# Patient Record
Sex: Female | Born: 1985 | Race: White | Hispanic: No | Marital: Single | Smoking: Former smoker
Health system: Southern US, Community
[De-identification: ages and names within clinical notes are randomized; demographics above are authoritative.]

---

## 2019-09-26 ENCOUNTER — Other Ambulatory Visit: Payer: Self-pay

## 2019-09-26 DIAGNOSIS — K567 Ileus, unspecified: Secondary | ICD-10-CM | POA: Diagnosis present

## 2019-09-26 DIAGNOSIS — Z87891 Personal history of nicotine dependence: Secondary | ICD-10-CM

## 2019-09-26 DIAGNOSIS — Z79899 Other long term (current) drug therapy: Secondary | ICD-10-CM

## 2019-09-26 DIAGNOSIS — Z20822 Contact with and (suspected) exposure to covid-19: Secondary | ICD-10-CM | POA: Diagnosis present

## 2019-09-26 DIAGNOSIS — K589 Irritable bowel syndrome without diarrhea: Secondary | ICD-10-CM | POA: Diagnosis present

## 2019-09-26 DIAGNOSIS — K56609 Unspecified intestinal obstruction, unspecified as to partial versus complete obstruction: Secondary | ICD-10-CM | POA: Diagnosis not present

## 2019-09-26 DIAGNOSIS — D72829 Elevated white blood cell count, unspecified: Secondary | ICD-10-CM | POA: Diagnosis present

## 2019-09-26 DIAGNOSIS — Z791 Long term (current) use of non-steroidal anti-inflammatories (NSAID): Secondary | ICD-10-CM

## 2019-09-26 DIAGNOSIS — Z803 Family history of malignant neoplasm of breast: Secondary | ICD-10-CM

## 2019-09-26 DIAGNOSIS — Z882 Allergy status to sulfonamides status: Secondary | ICD-10-CM

## 2019-09-26 DIAGNOSIS — K56600 Partial intestinal obstruction, unspecified as to cause: Secondary | ICD-10-CM | POA: Diagnosis not present

## 2019-09-26 DIAGNOSIS — Z8249 Family history of ischemic heart disease and other diseases of the circulatory system: Secondary | ICD-10-CM

## 2019-09-26 LAB — URINALYSIS, COMPLETE (UACMP) WITH MICROSCOPIC
Bilirubin Urine: NEGATIVE
Glucose, UA: NEGATIVE mg/dL
Ketones, ur: NEGATIVE mg/dL
Nitrite: NEGATIVE
Protein, ur: NEGATIVE mg/dL
Specific Gravity, Urine: 1.018 (ref 1.005–1.030)
pH: 7 (ref 5.0–8.0)

## 2019-09-26 LAB — CBC
HCT: 45.7 % (ref 36.0–46.0)
Hemoglobin: 15.3 g/dL — ABNORMAL HIGH (ref 12.0–15.0)
MCH: 29.5 pg (ref 26.0–34.0)
MCHC: 33.5 g/dL (ref 30.0–36.0)
MCV: 88.2 fL (ref 80.0–100.0)
Platelets: 456 K/uL — ABNORMAL HIGH (ref 150–400)
RBC: 5.18 MIL/uL — ABNORMAL HIGH (ref 3.87–5.11)
RDW: 12.5 % (ref 11.5–15.5)
WBC: 13.4 K/uL — ABNORMAL HIGH (ref 4.0–10.5)
nRBC: 0 % (ref 0.0–0.2)

## 2019-09-26 LAB — COMPREHENSIVE METABOLIC PANEL
ALT: 14 U/L (ref 0–44)
AST: 20 U/L (ref 15–41)
Albumin: 4.7 g/dL (ref 3.5–5.0)
Alkaline Phosphatase: 63 U/L (ref 38–126)
Anion gap: 9 (ref 5–15)
BUN: 16 mg/dL (ref 6–20)
CO2: 27 mmol/L (ref 22–32)
Calcium: 8.9 mg/dL (ref 8.9–10.3)
Chloride: 102 mmol/L (ref 98–111)
Creatinine, Ser: 0.92 mg/dL (ref 0.44–1.00)
GFR calc Af Amer: >60 mL/min (ref >60)
GFR calc non Af Amer: >60 mL/min (ref >60)
Glucose, Bld: 121 mg/dL — ABNORMAL HIGH (ref 70–99)
Potassium: 4 mmol/L (ref 3.5–5.1)
Sodium: 138 mmol/L (ref 135–145)
Total Bilirubin: 0.7 mg/dL (ref 0.3–1.2)
Total Protein: 7.9 g/dL (ref 6.5–8.1)

## 2019-09-26 LAB — LIPASE, BLOOD: Lipase: 24 U/L (ref 11–51)

## 2019-09-26 LAB — POCT PREGNANCY, URINE: Preg Test, Ur: NEGATIVE

## 2019-09-26 NOTE — ED Triage Notes (Signed)
Pt to the er for abd pain x 1 week around the umbilicus. Pt denies period cramps. Pt states she has nausea when the pain is at it's worst. Pt denies with urination. Pt last BM was this am and regular. Pt reports worse pain with eating.

## 2019-09-27 ENCOUNTER — Emergency Department: Payer: Managed Care, Other (non HMO)

## 2019-09-27 ENCOUNTER — Other Ambulatory Visit: Payer: Self-pay

## 2019-09-27 ENCOUNTER — Inpatient Hospital Stay: Payer: Managed Care, Other (non HMO)

## 2019-09-27 ENCOUNTER — Inpatient Hospital Stay
Admission: EM | Admit: 2019-09-27 | Discharge: 2019-09-29 | DRG: 390 | Disposition: A | Discharge status: Home / Self Care | Payer: Managed Care, Other (non HMO) | Attending: Internal Medicine | Admitting: Internal Medicine

## 2019-09-27 DIAGNOSIS — K567 Ileus, unspecified: Secondary | ICD-10-CM | POA: Diagnosis present

## 2019-09-27 DIAGNOSIS — K581 Irritable bowel syndrome with constipation: Secondary | ICD-10-CM | POA: Diagnosis not present

## 2019-09-27 DIAGNOSIS — Z79899 Other long term (current) drug therapy: Secondary | ICD-10-CM | POA: Diagnosis not present

## 2019-09-27 DIAGNOSIS — K589 Irritable bowel syndrome without diarrhea: Secondary | ICD-10-CM | POA: Diagnosis present

## 2019-09-27 DIAGNOSIS — K56600 Partial intestinal obstruction, unspecified as to cause: Principal | ICD-10-CM

## 2019-09-27 DIAGNOSIS — Z20822 Contact with and (suspected) exposure to covid-19: Secondary | ICD-10-CM | POA: Diagnosis present

## 2019-09-27 DIAGNOSIS — Z882 Allergy status to sulfonamides status: Secondary | ICD-10-CM | POA: Diagnosis not present

## 2019-09-27 DIAGNOSIS — D72829 Elevated white blood cell count, unspecified: Secondary | ICD-10-CM | POA: Diagnosis present

## 2019-09-27 DIAGNOSIS — Z803 Family history of malignant neoplasm of breast: Secondary | ICD-10-CM | POA: Diagnosis not present

## 2019-09-27 DIAGNOSIS — Z87891 Personal history of nicotine dependence: Secondary | ICD-10-CM | POA: Diagnosis not present

## 2019-09-27 DIAGNOSIS — Z8249 Family history of ischemic heart disease and other diseases of the circulatory system: Secondary | ICD-10-CM | POA: Diagnosis not present

## 2019-09-27 DIAGNOSIS — K56609 Unspecified intestinal obstruction, unspecified as to partial versus complete obstruction: Secondary | ICD-10-CM | POA: Diagnosis present

## 2019-09-27 DIAGNOSIS — Z791 Long term (current) use of non-steroidal anti-inflammatories (NSAID): Secondary | ICD-10-CM | POA: Diagnosis not present

## 2019-09-27 LAB — SARS CORONAVIRUS 2 (TAT 6-24 HRS): SARS Coronavirus 2: NEGATIVE

## 2019-09-27 MED ORDER — MORPHINE SULFATE (PF) 4 MG/ML IV SOLN
4.0000 mg | Freq: Once | INTRAVENOUS | Status: AC
  Administered 2019-09-27: 4 mg via INTRAVENOUS

## 2019-09-27 MED ORDER — ONDANSETRON HCL 4 MG PO TABS: 4.0000 mg | ORAL_TABLET | Freq: Four times a day (QID) | ORAL | Status: DC | PRN

## 2019-09-27 MED ORDER — ONDANSETRON HCL 4 MG/2ML IJ SOLN: 4.0000 mg | Freq: Four times a day (QID) | INTRAMUSCULAR | Status: DC | PRN

## 2019-09-27 MED ORDER — ACETAMINOPHEN 650 MG RE SUPP: 650.0000 mg | Freq: Four times a day (QID) | RECTAL | Status: DC | PRN

## 2019-09-27 MED ORDER — ACETAMINOPHEN 325 MG PO TABS: 650.0000 mg | ORAL_TABLET | Freq: Four times a day (QID) | ORAL | Status: DC | PRN

## 2019-09-27 MED ORDER — POTASSIUM CHLORIDE IN NACL 20-0.9 MEQ/L-% IV SOLN
INTRAVENOUS | Status: DC
  Filled 2019-09-27 (×5): qty 1000

## 2019-09-27 MED ORDER — ENOXAPARIN SODIUM 40 MG/0.4ML ~~LOC~~ SOLN
40.0000 mg | SUBCUTANEOUS | Status: DC
  Administered 2019-09-27: 40 mg via SUBCUTANEOUS
  Filled 2019-09-27 (×2): qty 0.4

## 2019-09-27 MED ORDER — MORPHINE SULFATE (PF) 2 MG/ML IV SOLN
2.0000 mg | INTRAVENOUS | Status: DC | PRN
  Administered 2019-09-27 – 2019-09-28 (×3): 2 mg via INTRAVENOUS
  Filled 2019-09-27 (×4): qty 1

## 2019-09-27 MED ORDER — LIDOCAINE VISCOUS HCL 2 % MT SOLN
15.0000 mL | Freq: Once | OROMUCOSAL | Status: AC
  Administered 2019-09-27: 15 mL via OROMUCOSAL
  Filled 2019-09-27: qty 15

## 2019-09-27 MED ORDER — TRAZODONE HCL 50 MG PO TABS: 25.0000 mg | ORAL_TABLET | Freq: Every evening | ORAL | Status: DC | PRN

## 2019-09-27 MED ORDER — IOHEXOL 300 MG/ML  SOLN
100.0000 mL | Freq: Once | INTRAMUSCULAR | Status: AC | PRN
  Administered 2019-09-27: 100 mL via INTRAVENOUS

## 2019-09-27 MED ORDER — MORPHINE SULFATE (PF) 4 MG/ML IV SOLN
4.0000 mg | Freq: Once | INTRAVENOUS | Status: AC
  Administered 2019-09-27: 4 mg via INTRAVENOUS
  Filled 2019-09-27: qty 1

## 2019-09-27 MED ORDER — MORPHINE SULFATE (PF) 4 MG/ML IV SOLN
INTRAVENOUS | Status: AC
  Filled 2019-09-27: qty 1

## 2019-09-27 MED ORDER — ONDANSETRON HCL 4 MG/2ML IJ SOLN
4.0000 mg | INTRAMUSCULAR | Status: AC
  Administered 2019-09-27: 4 mg via INTRAVENOUS

## 2019-09-27 MED ORDER — PANTOPRAZOLE SODIUM 40 MG IV SOLR
40.0000 mg | Freq: Two times a day (BID) | INTRAVENOUS | Status: DC
  Administered 2019-09-27 – 2019-09-28 (×3): 40 mg via INTRAVENOUS
  Filled 2019-09-27 (×3): qty 40

## 2019-09-27 MED ORDER — SODIUM CHLORIDE 0.9 % IV SOLN: Freq: Once | INTRAVENOUS | Status: AC

## 2019-09-27 MED ORDER — ONDANSETRON HCL 4 MG/2ML IJ SOLN
INTRAMUSCULAR | Status: AC
  Filled 2019-09-27: qty 2

## 2019-09-27 MED ORDER — SODIUM CHLORIDE 0.9 % IV BOLUS
1000.0000 mL | Freq: Once | INTRAVENOUS | Status: AC
  Administered 2019-09-27: 1000 mL via INTRAVENOUS

## 2019-09-27 MED ORDER — PHENOL 1.4 % MT LIQD
1.0000 | OROMUCOSAL | Status: DC | PRN
  Administered 2019-09-27: 1 via OROMUCOSAL
  Filled 2019-09-27: qty 177

## 2019-09-27 NOTE — Consult Note (Addendum)
Ann Gibson , MD [address] [place], [place], [place], [address] [address], [place], [place], [place], [address] Phone: [phone]  Fax: [phone]  Consultation  Referring Provider:   Dr. Sidney Ace primary Care Physician:  System, Pcp Not In Primary Gastroenterologist: None         Reason for Consultation:     Irritable bowel syndrome  Date of Admission:  09/27/2019 Date of Consultation:  09/27/2019         HPI:   Ann Gibson is a 34 y.o. female previously carries a diagnosis of irritable bowel syndrome.  Patient presented to the emergency room with abdominal pain with nausea and vomiting since early this morning.  In the emergency room she underwent a CT scan of the abdomen with contrast that demonstrated partial small bowel obstruction with transition in the mid abdomen.  No clear obstruction was noted.  She also went a right upper quadrant ultrasound which was normal.Hemoglobin of 15.3 g and LFTs are normal.  The patient was seen by Dr. Hampton Abbot.  He discussed with me and was concerned about thickening in the distal small bowel.  She states that about 2 weeks back she developed some generalized abdominal discomfort around her navel.  Worse when she ate something to scribes it as a sharp pain.  No clear relieving factors but made worse by eating.  Gradually the pain got worse and hence she came into the hospital after she had 1 bout of vomiting.  It was not clearly feculent by her description.  Presently she has no pain.  Denies any prior history of Crohn's disease or IBD.  No family history of the same.  She uses Excedrin on a regular basis for headaches.  Denies any change in bowel movements.  She does recall she has issues with her bowels ranging from constipation to diarrhea.  She particularly mentions that she has to drink something in the morning to stimulate a bowel movement.   History reviewed. No pertinent past medical history.  History reviewed. No pertinent surgical  history.  Prior to Admission medications   Medication Sig Start Date End Date Taking? Authorizing Provider  Aspirin-Acetaminophen-Caffeine (EXCEDRIN PO) Take 2 tablets by mouth every 6 (six) hours as needed.   Yes [provider]  Creatine Monohydrate POWD Take 0.5 g by mouth daily.   Yes [provider]  GLUTAMINE PO Take 1 each by mouth every morning.   Yes [provider]  ibuprofen (ADVIL) 200 MG tablet Take 200-400 mg by mouth every 6 (six) hours as needed for headache.   Yes [provider]  Multiple Vitamin (MULTIVITAMIN) capsule Take 1 capsule by mouth daily.   Yes [provider]  Probiotic Product (PROBIOTIC-10 PO) Take 1 capsule by mouth daily.   Yes [provider]  S-Adenosylmethionine (SAM-E) 200 MG TABS Take 400 mg by mouth daily.   Yes [provider]    No family history on file.   Social History   Tobacco Use  . Smoking status: Former Research scientist (life sciences)  . Smokeless tobacco: Never Used  Substance Use Topics  . Alcohol use: Yes    Comment: socail  . Drug use: Never    Allergies as of 09/26/2019 - Review Complete 09/26/2019  Allergen Reaction Noted  . Sulfa antibiotics  09/26/2019    Review of Systems:    All systems reviewed and negative except where noted in HPI.   Physical Exam:  Vital signs in last 24 hours: Temp:  [  98.4 F (36.9 C)-98.7 F (37.1 C)] 98.7 F (37.1 C) (04/08 0724) Pulse Rate:  [46-96] 77 (04/08 1400) Resp:  [12-25] 20 (04/08 1400) BP: (98-134)/(48-91) 123/71 (04/08 1400) SpO2:  [94 %-100 %] 96 % (04/08 1400) Weight:  [68 kg] 68 kg (04/07 2151)   General:   Pleasant, cooperative in NAD Head:  Normocephalic and atraumatic. Eyes:   No icterus.   Conjunctiva pink. PERRLA. Ears:  Normal auditory acuity. Lungs: Respirations even and unlabored. Lungs clear to auscultation bilaterally.   No wheezes, crackles, or rhonchi.  Heart:  Regular rate and rhythm;  Without murmur, clicks, rubs or  gallops Abdomen:  Soft, nondistended, nontender. Normal bowel sounds. No appreciable masses or hepatomegaly.  No rebound or guarding.  Neurologic:  Alert and oriented x3;  grossly normal neurologically. Psych:  Alert and cooperative. Normal affect.  LAB RESULTS: Recent Labs    09/26/19 2157  WBC 13.4*  HGB 15.3*  HCT 45.7  PLT 456*   BMET Recent Labs    09/26/19 2157  NA 138  K 4.0  CL 102  CO2 27  GLUCOSE 121*  BUN 16  CREATININE 0.92  CALCIUM 8.9   LFT Recent Labs    09/26/19 2157  PROT 7.9  ALBUMIN 4.7  AST 20  ALT 14  ALKPHOS 63  BILITOT 0.7   PT/INR No results for input(s): LABPROT, INR in the last 72 hours.  STUDIES: CT ABDOMEN PELVIS W CONTRAST  Result Date: 09/27/2019 CLINICAL DATA:  Nausea and vomiting with right lower quadrant pain EXAM: CT ABDOMEN AND PELVIS WITH CONTRAST TECHNIQUE: Multidetector CT imaging of the abdomen and pelvis was performed using the standard protocol following bolus administration of intravenous contrast. CONTRAST:  OMNIPAQUE IOHEXOL 300 MG/ML  SOLN COMPARISON:  None. FINDINGS: Lower chest:  No contributory findings. Hepatobiliary: No focal liver abnormality.No evidence of biliary obstruction or stone. Pancreas: Unremarkable. Spleen: Unremarkable. Adrenals/Urinary Tract: Negative adrenals. No hydronephrosis or stone. Unremarkable bladder. Stomach/Bowel: Small bowel loops are fluid-filled and dilated proximally and decompressed distally. There is a transition in the central abdomen without underlying twist, wall thickening, or mass. The mesentery of the dilated loops is mildly edematous. Negative colon. No appendicitis. Vascular/Lymphatic: No acute vascular abnormality. No mass or adenopathy. Reproductive:Negative. Other: Trace pelvic ascites considered reactive. Musculoskeletal: No acute abnormalities. IMPRESSION: Partial small bowel obstruction with transition in the mid abdomen. No visible underlying cause, has there been prior  abdominal surgery? Electronically Signed   By: Marnee Spring M.D.   On: 09/27/2019 05:39   US ABDOMEN LIMITED RUQ  Result Date: 09/27/2019 CLINICAL DATA:  Initial evaluation for episodic epigastric and right upper quadrant pain for 1 week with nausea and vomiting. EXAM: ULTRASOUND ABDOMEN LIMITED RIGHT UPPER QUADRANT COMPARISON:  None available. FINDINGS: Gallbladder: No gallstones or wall thickening visualized. No sonographic Murphy sign noted by sonographer. Common bile duct: Diameter: 4.9 mm Liver: No focal lesion identified. Within normal limits in parenchymal echogenicity. Portal vein is patent on color Doppler imaging with normal direction of blood flow towards the liver. Other: None. IMPRESSION: Normal right upper quadrant ultrasound. Electronically Signed   By: Rise Mu M.D.   On: 09/27/2019 04:23      Impression / Plan:   Ann Gibson is a 34 y.o. y/o female presents to the emergency room with abdominal pain.  CT scan of the abdomen shows small bowel obstruction with a transition point.  No clear obstruction is seen within the lumen.?  Adhesions.  She also carries  a diagnosis of irritable bowel syndrome for which I have been consulted.  She has been seen by Dr. Aleen Campi and surgery.  From the GI point of view irritable bowel syndrome is not a condition that needs to be addressed urgently as an inpatient.  It would be probably best addressed as an outpatient once she is past the acute phase of of the bowel obstruction.  Agree with NG tube which is already in place, correcting electrolytes, limiting use of narcotics.  Reimaging at some point once NG tube to suction has stopped..  I would suggesteither an MR enterography or a CT enterography which would be more accurate in evaluating the small bowel for mucosal inflammation/thickening and luminal lesions.  This is particularly helpful in evaluating of Crohn's disease.  I would also add a CRP to her labs to check for any inflammation  and obtain a baseline.  Chronic NSAID use may at times cause strictures of the small bowel.  Based on these results of the enterography we can decide timing of a colonoscopy with ileal intubation if appropriate.  Obviously at this point of time with her small bowel obstruction she can undergo colonoscopy as she cannot do a bowel prep.  I have strongly suggested that she stop using NSAIDs regularly and consult her neurologist for migraine prophylaxis  Thank you for involving me in the care of this patient.      LOS: 0 days   Wyline Mood, MD  09/27/2019, 2:45 PM

## 2019-09-27 NOTE — ED Notes (Signed)
Pt denies pain or nausea at this time. Pt is calm and happy. No distress.

## 2019-09-27 NOTE — ED Provider Notes (Signed)
[facility] Emergency Department Provider Note  ____________________________________________   First MD Initiated Contact with Patient 09/27/19 [phone]     (approximate)  I have reviewed the triage vital signs and the nursing notes.   HISTORY  Chief Complaint Abdominal Pain    HPI Ann Gibson is a 34 y.o. female with no chronic medical issues and no prior abdominal surgeries who presents for evaluation of about a week of intermittent right-sided abdominal pain.  She reports that sometimes it is sharp and severe and other times it is a dull aching pain.  Eating seems to make things worse including tonight when the pain was the most severe and encouraged her to come to the emergency department.  She has had less appetite over the last couple of days.  She has had nausea but did not vomit until tonight when the pain was at its worst.  It is frequently located in the right side or around her umbilicus, but tonight the pain was up higher, below her ribs in the center and on the right side.  It does not radiate through to her back.  She denies fever/chills, sore throat, chest pain, cough, shortness of breath, and dysuria.  No recent vaginal complaints or concerns and no recent vaginal bleeding.         History reviewed. No pertinent past medical history.  Patient Active Problem List   Diagnosis Date Noted  . SBO (small bowel obstruction) (HCC) 09/27/2019    History reviewed. No pertinent surgical history.  Prior to Admission medications   Medication Sig Start Date End Date Taking? Authorizing Provider  Aspirin-Acetaminophen-Caffeine (EXCEDRIN PO) Take 2 tablets by mouth every 6 (six) hours as needed.   Yes [provider]  Creatine Monohydrate POWD Take 0.5 g by mouth daily.   Yes [provider]  GLUTAMINE PO Take 1 each by mouth every morning.   Yes [provider]  ibuprofen (ADVIL) 200 MG tablet Take 200-400 mg by mouth every 6  (six) hours as needed for headache.   Yes [provider]  Multiple Vitamin (MULTIVITAMIN) capsule Take 1 capsule by mouth daily.   Yes [provider]  Probiotic Product (PROBIOTIC-10 PO) Take 1 capsule by mouth daily.   Yes [provider]  S-Adenosylmethionine (SAM-E) 200 MG TABS Take 400 mg by mouth daily.   Yes [provider]    Allergies Sulfa antibiotics  No family history on file.  Social History Social History   Tobacco Use  . Smoking status: Former Games developer  . Smokeless tobacco: Never Used  Substance Use Topics  . Alcohol use: Yes    Comment: socail  . Drug use: Never    Review of Systems Constitutional: No fever/chills Eyes: No visual changes. ENT: No sore throat. Cardiovascular: Denies chest pain. Respiratory: Denies shortness of breath. Gastrointestinal: Abdominal pain with nausea and vomiting as described above. Genitourinary: Negative for dysuria. Musculoskeletal: Negative for neck pain.  Negative for back pain. Integumentary: Negative for rash. Neurological: Negative for headaches, focal weakness or numbness.   ____________________________________________   PHYSICAL EXAM:  VITAL SIGNS: ED Triage Vitals  Enc Vitals Group     BP 09/26/19 2153 128/61     Pulse Rate 09/26/19 2153 83     Resp 09/26/19 2153 18     Temp 09/26/19 2153 98.4 F (36.9 C)     Temp Source 09/26/19 2153 Oral     SpO2 09/26/19 2153 100 %     Weight 09/26/19  2151 68 kg (150 lb)     Height 09/26/19 2151 1.626 m (5\' 4" )     Head Circumference --      Peak Flow --      Pain Score 09/26/19 2151 3     Pain Loc --      Pain Edu? --      Excl. in GC? --     Constitutional: Alert and oriented.  Appears uncomfortable. Eyes: Conjunctivae are normal.  Head: Atraumatic. Nose: No congestion/rhinnorhea. Mouth/Throat: Patient is wearing a mask. Neck: No stridor.  No meningeal signs.   Cardiovascular: Normal rate, regular rhythm. Good peripheral  circulation. Grossly normal heart sounds. Respiratory: Normal respiratory effort.  No retractions. Gastrointestinal: Soft and nondistended.  Mild tenderness to palpation throughout the upper abdomen, equivocal Murphy sign.  No lower abdominal tenderness and no rebound or guarding. Musculoskeletal: No lower extremity tenderness nor edema. No gross deformities of extremities. Neurologic:  Normal speech and language. No gross focal neurologic deficits are appreciated.  Skin:  Skin is warm, dry and intact. Psychiatric: Mood and affect are normal. Speech and behavior are normal.  ____________________________________________   LABS (all labs ordered are listed, but only abnormal results are displayed)  Labs Reviewed  COMPREHENSIVE METABOLIC PANEL - Abnormal; Notable for the following components:      Result Value   Glucose, Bld 121 (*)    All other components within normal limits  CBC - Abnormal; Notable for the following components:   WBC 13.4 (*)    RBC 5.18 (*)    Hemoglobin 15.3 (*)    Platelets 456 (*)    All other components within normal limits  URINALYSIS, COMPLETE (UACMP) WITH MICROSCOPIC - Abnormal; Notable for the following components:   Color, Urine YELLOW (*)    APPearance CLEAR (*)    Hgb urine dipstick SMALL (*)    Leukocytes,Ua TRACE (*)    Bacteria, UA RARE (*)    All other components within normal limits  SARS CORONAVIRUS 2 (TAT 6-24 HRS)  LIPASE, BLOOD  POC URINE PREG, ED  POCT PREGNANCY, URINE   ____________________________________________  EKG  None - EKG not ordered by ED physician ____________________________________________  RADIOLOGY 2152, personally viewed and evaluated these images (plain radiographs) as part of my medical decision making, as well as reviewing the written report by the radiologist.  ED MD interpretation:  partial SBO on CT, unremarkable U/S  Official radiology report(s): CT ABDOMEN PELVIS W CONTRAST  Result Date:  09/27/2019 CLINICAL DATA:  Nausea and vomiting with right lower quadrant pain EXAM: CT ABDOMEN AND PELVIS WITH CONTRAST TECHNIQUE: Multidetector CT imaging of the abdomen and pelvis was performed using the standard protocol following bolus administration of intravenous contrast. CONTRAST:  11/27/2019 OMNIPAQUE IOHEXOL 300 MG/ML  SOLN COMPARISON:  None. FINDINGS: Lower chest:  No contributory findings. Hepatobiliary: No focal liver abnormality.No evidence of biliary obstruction or stone. Pancreas: Unremarkable. Spleen: Unremarkable. Adrenals/Urinary Tract: Negative adrenals. No hydronephrosis or stone. Unremarkable bladder. Stomach/Bowel: Small bowel loops are fluid-filled and dilated proximally and decompressed distally. There is a transition in the central abdomen without underlying twist, wall thickening, or mass. The mesentery of the dilated loops is mildly edematous. Negative colon. No appendicitis. Vascular/Lymphatic: No acute vascular abnormality. No mass or adenopathy. Reproductive:Negative. Other: Trace pelvic ascites considered reactive. Musculoskeletal: No acute abnormalities. IMPRESSION: Partial small bowel obstruction with transition in the mid abdomen. No visible underlying cause, has there been prior abdominal surgery? Electronically Signed   By:  Watts M.D.   On: 09/27/2019 05:39   US ABDOMEN LIMITED RUQ  Result Date: 09/27/2019 CLINICAL DATA:  Initial evaluation for episodic epigastric and right upper quadrant pain for 1 week with nausea and vomiting. EXAM: ULTRASOUND ABDOMEN LIMITED RIGHT UPPER QUADRANT COMPARISON:  None available. FINDINGS: Gallbladder: No gallstones or wall thickening visualized. No sonographic Murphy sign noted by sonographer. Common bile duct: Diameter: 4.9 mm Liver: No focal lesion identified. Within normal limits in parenchymal echogenicity. Portal vein is patent on color Doppler imaging with normal direction of blood flow towards the liver. Other: None. IMPRESSION:  Normal right upper quadrant ultrasound. Electronically Signed   By: Rise Mu M.D.   On: 09/27/2019 04:23    ____________________________________________   PROCEDURES   Procedure(s) performed (including Critical Care):  Procedures   ____________________________________________   INITIAL IMPRESSION / MDM / ASSESSMENT AND PLAN / ED COURSE  As part of my medical decision making, I reviewed the following data within the electronic MEDICAL RECORD NUMBER History obtained from family, Nursing notes reviewed and incorporated, Labs reviewed , Old chart reviewed, Discussed with admitting physician  and Notes from prior ED visits   Differential diagnosis includes, but is not limited to, biliary colic or other gallbladder disease, appendicitis, epiploic appendagitis, mesenteric adenitis, ovarian cyst, UTI/pyelonephritis.  The history is most consistent with gallbladder disease although she is relatively pain-free now but that is after receiving morphine 4 mg IV and Zofran 4 mg IV.  Vital signs are stable and she is afebrile.  She has a leukocytosis of 13.4 and I would think that if she had been developing an appendicitis over the last 1+ week she would be considerably more ill at this point.  Urine pregnancy test is negative and there is no sign of UTI in the urinalysis.  Comprehensive metabolic panel including LFTs are within normal limits.  Discussed the plan of evaluation with the patient and she agrees with the current plan for ultrasound.  If the ultrasound is normal or equivocal I will proceed with CT scan of the abdomen and pelvis with IV contrast.      Clinical Course as of Sep 27 719  Thu Sep 27, 2019  0435 Normal ultrasound, so we will proceed with CT abd/pelvis  US ABDOMEN LIMITED RUQ [CF]  0540 CT scan is notable for partial bowel obstruction without obvious transition point.  No structural abnormalities that would provide an obvious cause.  I updated the patient and she said  that her pain is returning although her nausea has improved.  We discussed at length the various options and I think the appropriate answer is to admit the patient for bowel rest with NG tube placement.  She agrees with the plan.  Nonemergent Covid screening is pending.  NG tube will be placed with viscous lidocaine for comfort.  I will page the hospitalist service.   [CF]  [phone] Discussed case with Dr. Arville Care with the hospitalist service who will admit.   [CF]    Clinical Course User Index [CF] Loleta Rose, MD     ____________________________________________  FINAL CLINICAL IMPRESSION(S) / ED DIAGNOSES  Final diagnoses:  Partial small bowel obstruction (HCC)     MEDICATIONS GIVEN DURING THIS VISIT:  Medications  0.9 %  sodium chloride infusion (has no administration in time range)  morphine 4 MG/ML injection 4 mg (4 mg Intravenous Given 09/27/19 0357)  ondansetron (ZOFRAN) injection 4 mg (4 mg Intravenous Given 09/27/19 0357)  sodium chloride 0.9 % bolus 1,000 mL (  0 mLs Intravenous Stopped 09/27/19 0442)  iohexol (OMNIPAQUE) 300 MG/ML solution 100 mL (100 mLs Intravenous Contrast Given 09/27/19 0458)  lidocaine (XYLOCAINE) 2 % viscous mouth solution 15 mL (15 mLs Mouth/Throat Given 09/27/19 0645)  morphine 4 MG/ML injection 4 mg (4 mg Intravenous Given 09/27/19 0881)     ED Discharge Orders    None      *Please note:  Zasha Belleau was evaluated in Emergency Department on 09/27/2019 for the symptoms described in the history of present illness. She was evaluated in the context of the global COVID-19 pandemic, which necessitated consideration that the patient might be at risk for infection with the SARS-CoV-2 virus that causes COVID-19. Institutional protocols and algorithms that pertain to the evaluation of patients at risk for COVID-19 are in a state of rapid change based on information released by regulatory bodies including the CDC and federal and state organizations. These policies and  algorithms were followed during the patient's care in the ED.  Some ED evaluations and interventions may be delayed as a result of limited staffing during the pandemic.*  Note:  This document was prepared using Dragon voice recognition software and may include unintentional dictation errors.   Hinda Kehr, MD 09/27/19 [phone]

## 2019-09-27 NOTE — ED Notes (Signed)
Pt actively vomiting. Pt given an emesis bag.

## 2019-09-27 NOTE — H&P (Signed)
[facility] NAME: Ann Gibson    MR#:  [id number]  DATE OF BIRTH:  25-Mar-1986  DATE OF ADMISSION:  09/27/2019  PRIMARY CARE PHYSICIAN: System, Pcp Not In   REQUESTING/REFERRING PHYSICIAN: Hinda Kehr, MD CHIEF COMPLAINT:   Chief Complaint  Patient presents with   Abdominal Pain    HISTORY OF PRESENT ILLNESS:  Ann Gibson  is a 34 y.o. pleasant Caucasian female with a known history of suspected IBS, who presented to the emergency room with acute onset of intermittent abdominal pain for the last week with associated nausea and vomiting since early this morning.  She denied any diarrhea.  Her last bowel movement was yesterday morning and it was hard and small.  She denied any fever or chills however she stated that she has been feeling sweaty.  No cough or dyspnea.  No melena or bright red bleeding per rectum.  She denies any bilious vomitus or hematemesis.  No headache or dizziness or blurred vision.  She stated that she has been binge eating sometimes.  She is an no prescription medications for IBS.  She stated that caffeine has been regulating her bowel movements and lately she has used MCT Oil.  Upon presentation to the emergency room, vital signs were within normal.  Labs reveal leukocytosis of 13.4 and lipase was normal at 24.  Urinalysis was unremarkable and urine pregnancy test was negative.  Abdominal pelvic CT scan showed partial small bowel obstruction with transition in the mid abdomen.  Right upper quadrant ultrasound was normal.  The patient was given 4 mg of IV morphine sulfate twice and 4 mg of IV Zofran as well as 1 L bolus of IV normal saline followed 100 mL/h.  She had NG tube placed with aspiration of 200 mL gastric aspirate so far.  She will be admitted to bed for further evaluation and management. PAST MEDICAL HISTORY:  History reviewed. No pertinent past medical history.  Suspected history of IBS on no prescription medication  for it.  PAST SURGICAL HISTORY:  History reviewed. No pertinent surgical history.  She denies any previous surgeries.  SOCIAL HISTORY:   Social History   Tobacco Use   Smoking status: Former Smoker   Smokeless tobacco: Never Used  Substance Use Topics   Alcohol use: Yes    Comment: socail    FAMILY HISTORY:  No family history on file.  Maternal grandmother had breast cancer an MI.  DRUG ALLERGIES:   Allergies  Allergen Reactions   Sulfa Antibiotics Swelling    REVIEW OF SYSTEMS:   ROS As per history of present illness. All pertinent systems were reviewed above. Constitutional,  HEENT, cardiovascular, respiratory, GI, GU, musculoskeletal, neuro, psychiatric, endocrine,  integumentary and hematologic systems were reviewed and are otherwise  negative/unremarkable except for positive findings mentioned above in the HPI.   MEDICATIONS AT HOME:   Prior to Admission medications   Medication Sig Start Date End Date Taking? Authorizing Provider  Aspirin-Acetaminophen-Caffeine (EXCEDRIN PO) Take 2 tablets by mouth every 6 (six) hours as needed.   Yes [provider]  Creatine Monohydrate POWD Take 0.5 g by mouth daily.   Yes [provider]  GLUTAMINE PO Take 1 each by mouth every morning.   Yes [provider]  ibuprofen (ADVIL) 200 MG tablet Take 200-400 mg by mouth every 6 (six) hours as needed for headache.   Yes [provider]  Multiple Vitamin (MULTIVITAMIN) capsule Take 1 capsule by  mouth daily.   Yes [provider]  Probiotic Product (PROBIOTIC-10 PO) Take 1 capsule by mouth daily.   Yes [provider]  S-Adenosylmethionine (SAM-E) 200 MG TABS Take 400 mg by mouth daily.   Yes [provider]      VITAL SIGNS:  Blood pressure 119/71, pulse 96, temperature 98.7 F (37.1 C), temperature source Oral, resp. rate 16, height 5\' 4"  (1.626 m), weight 68 kg, SpO2 96 %.  PHYSICAL EXAMINATION:  Physical  Exam  GENERAL:  34 y.o.-year-old pleasant Caucasian female patient lying in the bed with no acute distress.  EYES: Pupils equal, round, reactive to light and accommodation. No scleral icterus. Extraocular muscles intact.  HEENT: Head atraumatic, normocephalic. Oropharynx and nasopharynx with intact NG tube with minimal bloody drainage from nasal trauma. NECK:  Supple, no jugular venous distention. No thyroid enlargement, no tenderness.  LUNGS: Normal breath sounds bilaterally, no wheezing, rales,rhonchi or crepitation. No use of accessory muscles of respiration.  CARDIOVASCULAR: Regular rate and rhythm, S1, S2 normal. No murmurs, rubs, or gallops.  ABDOMEN: Soft, nondistended, nontender.  Significant diminished bowel sounds.. No organomegaly or mass.  EXTREMITIES: No pedal edema, cyanosis, or clubbing.  NEUROLOGIC: Cranial nerves II through XII are intact. Muscle strength 5/5 in all extremities. Sensation intact. Gait not checked.  PSYCHIATRIC: The patient is alert and oriented x 3.  Normal affect and good eye contact. SKIN: No obvious rash, lesion, or ulcer.   LABORATORY PANEL:   CBC Recent Labs  Lab 09/26/19 2157  WBC 13.4*  HGB 15.3*  HCT 45.7  PLT 456*   ------------------------------------------------------------------------------------------------------------------  Chemistries  Recent Labs  Lab 09/26/19 2157  NA 138  K 4.0  CL 102  CO2 27  GLUCOSE 121*  BUN 16  CREATININE 0.92  CALCIUM 8.9  AST 20  ALT 14  ALKPHOS 63  BILITOT 0.7   ------------------------------------------------------------------------------------------------------------------  Cardiac Enzymes No results for input(s): TROPONINI in the last 168 hours. ------------------------------------------------------------------------------------------------------------------  RADIOLOGY:  CT ABDOMEN PELVIS W CONTRAST  Result Date: 09/27/2019 CLINICAL DATA:  Nausea and vomiting with right lower quadrant  pain EXAM: CT ABDOMEN AND PELVIS WITH CONTRAST TECHNIQUE: Multidetector CT imaging of the abdomen and pelvis was performed using the standard protocol following bolus administration of intravenous contrast. CONTRAST:  11/27/2019 OMNIPAQUE IOHEXOL 300 MG/ML  SOLN COMPARISON:  None. FINDINGS: Lower chest:  No contributory findings. Hepatobiliary: No focal liver abnormality.No evidence of biliary obstruction or stone. Pancreas: Unremarkable. Spleen: Unremarkable. Adrenals/Urinary Tract: Negative adrenals. No hydronephrosis or stone. Unremarkable bladder. Stomach/Bowel: Small bowel loops are fluid-filled and dilated proximally and decompressed distally. There is a transition in the central abdomen without underlying twist, wall thickening, or mass. The mesentery of the dilated loops is mildly edematous. Negative colon. No appendicitis. Vascular/Lymphatic: No acute vascular abnormality. No mass or adenopathy. Reproductive:Negative. Other: Trace pelvic ascites considered reactive. Musculoskeletal: No acute abnormalities. IMPRESSION: Partial small bowel obstruction with transition in the mid abdomen. No visible underlying cause, has there been prior abdominal surgery? Electronically Signed   By: M.D.   On: 09/27/2019 05:39   11/27/2019 ABDOMEN LIMITED RUQ  Result Date: 09/27/2019 CLINICAL DATA:  Initial evaluation for episodic epigastric and right upper quadrant pain for 1 week with nausea and vomiting. EXAM: ULTRASOUND ABDOMEN LIMITED RIGHT UPPER QUADRANT COMPARISON:  None available. FINDINGS: Gallbladder: No gallstones or wall thickening visualized. No sonographic Murphy sign noted by sonographer. Common bile duct: Diameter: 4.9 mm Liver: No focal lesion identified. Within normal limits in parenchymal  echogenicity. Portal vein is patent on color Doppler imaging with normal direction of blood flow towards the liver. Other: None. IMPRESSION: Normal right upper quadrant ultrasound. Electronically Signed   By: Rise Mu M.D.   On: 09/27/2019 04:23      IMPRESSION AND PLAN:   1.  Partial small bowel obstruction. -The patient will be admitted to a medical bed. -She will be kept n.p.o. -We will continue NG tube to low intermittent suction. -We will optimize her potassium and electrolytes. -Pain management will be provided. -IV PPI therapy will be provided. -A general surgery consultation will be obtained by Dr. Aleen Campi.  I notified him regarding the patient. -We will repeat her two-view abdomen x-ray before advancing her diet.  2.  Suspected history of IBS. -A GI consultation will be obtained by Dr. Tobi Bastos who was notified about the patient.  3.  DVT prophylaxis. -Subcutaneous Lovenox.   All the records are reviewed and case discussed with ED provider. The plan of care was discussed in details with the patient (and family). I answered all questions. The patient agreed to proceed with the above mentioned plan. Further management will depend upon hospital course.   CODE STATUS: Full code  TOTAL TIME TAKING CARE OF THIS PATIENT: 50 minutes.    Hannah Beat M.D on 09/27/2019 at 7:36 AM  [facility]   From 7 PM-7 AM, contact night-coverage [email or web address]  CC: Primary care physician; System, Pcp Not In   Note: This dictation was prepared with Dragon dictation along with smaller phrase technology. Any transcriptional errors that result from this process are unintentional.

## 2019-09-27 NOTE — Consult Note (Signed)
[facility] SURGICAL CONSULTATION NOTE (initial) - cpt: 76160   HISTORY OF PRESENT ILLNESS (HPI):  34 y.o. female presented to [facility] ED early this morning for evaluation of abdominal pain. Patient reports around a 1 week history of intermittent waxing and waning right > left abdominal pain. This was described as a visceral crampy pain. This would come and go spontaneously initially however the pain became more constant and primarily on the right side which concerned her enough for presentation. She did have nausea and associated emesis in the last 24 hours. No fever, chills, cough, congestion, CP, SOB, or urinary changes. She reports that she has a history alternating constipation and occasional diarrhea in the past. She believes she was worked up for IBS as a child but does not carry a formal diagnosis. No bloody bowel movements. No previous abdominal surgeries. Work up in the ED was concerning for mild leukocytosis to 13.4K and CT Abdomen/Pelvis was concerning for possible SBO.   Surgery is consulted by hospitalist physician Dr. Valente David, MD in this context for evaluation and management of possible SBO.   PAST MEDICAL HISTORY (PMH):  History reviewed. No pertinent past medical history.   PAST SURGICAL HISTORY (PSH):  History reviewed. No pertinent surgical history.   MEDICATIONS:  Prior to Admission medications   Medication Sig Start Date End Date Taking? Authorizing Provider  Aspirin-Acetaminophen-Caffeine (EXCEDRIN PO) Take 2 tablets by mouth every 6 (six) hours as needed.   Yes [provider]  Creatine Monohydrate POWD Take 0.5 g by mouth daily.   Yes [provider]  GLUTAMINE PO Take 1 each by mouth every morning.   Yes [provider]  ibuprofen (ADVIL) 200 MG tablet Take 200-400 mg by mouth every 6 (six) hours as needed for headache.   Yes [provider]  Multiple Vitamin (MULTIVITAMIN) capsule Take 1 capsule by mouth daily.   Yes  [provider]  Probiotic Product (PROBIOTIC-10 PO) Take 1 capsule by mouth daily.   Yes [provider]  S-Adenosylmethionine (SAM-E) 200 MG TABS Take 400 mg by mouth daily.   Yes [provider]     ALLERGIES:  Allergies  Allergen Reactions  . Sulfa Antibiotics Swelling     SOCIAL HISTORY:  Social History   Socioeconomic History  . Marital status: Unknown    Spouse name: Not on file  . Number of children: Not on file  . Years of education: Not on file  . Highest education level: Not on file  Occupational History  . Not on file  Tobacco Use  . Smoking status: Former Games developer  . Smokeless tobacco: Never Used  Substance and Sexual Activity  . Alcohol use: Yes    Comment: socail  . Drug use: Never  . Sexual activity: Not on file  Other Topics Concern  . Not on file  Social History Narrative  . Not on file   Social Determinants of Health   Financial Resource Strain:   . Difficulty of Paying Living Expenses:   Food Insecurity:   . Worried About Programme researcher, broadcasting/film/video in the Last Year:   . Barista in the Last Year:   Transportation Needs:   . Freight forwarder (Medical):   Marland Kitchen Lack of Transportation (Non-Medical):   Physical Activity:   . Days of Exercise per Week:   . Minutes of Exercise per Session:   Stress:   . Feeling of Stress :   Social Connections:   .  Frequency of Communication with Friends and Family:   . Frequency of Social Gatherings with Friends and Family:   . Attends Religious Services:   . Active Member of Clubs or Organizations:   . Attends Banker Meetings:   Marland Kitchen Marital Status:   Intimate Partner Violence:   . Fear of Current or Ex-Partner:   . Emotionally Abused:   Marland Kitchen Physically Abused:   . Sexually Abused:      FAMILY HISTORY:  No family history on file.    REVIEW OF SYSTEMS:  Review of Systems  Constitutional: Negative for chills and fever.  HENT: Negative for congestion and sore  throat.   Respiratory: Negative for cough and shortness of breath.   Cardiovascular: Negative for chest pain and palpitations.  Gastrointestinal: Positive for abdominal pain, nausea and vomiting. Negative for blood in stool, constipation and diarrhea.  Genitourinary: Negative for dysuria and urgency.  All other systems reviewed and are negative.   VITAL SIGNS:  Temp:  [98.4 F (36.9 C)-98.7 F (37.1 C)] 98.7 F (37.1 C) (04/08 0724) Pulse Rate:  [46-96] 65 (04/08 1245) Resp:  [13-25] 13 (04/08 1245) BP: (98-134)/(48-91) 116/64 (04/08 1230) SpO2:  [94 %-100 %] 97 % (04/08 1245) Weight:  [68 kg] 68 kg (04/07 2151)     Height: 5\' 4"  (162.6 cm) Weight: 68 kg BMI (Calculated): 25.73   INTAKE/OUTPUT:  No intake/output data recorded.  PHYSICAL EXAM:  Physical Exam Vitals and nursing note reviewed.  Constitutional:      General: She is not in acute distress.    Appearance: She is well-developed and normal weight. She is not ill-appearing.  HENT:     Head: Normocephalic and atraumatic.  Eyes:     General: No scleral icterus.    Extraocular Movements: Extraocular movements intact.  Cardiovascular:     Rate and Rhythm: Normal rate and regular rhythm.     Heart sounds: Normal heart sounds. No murmur.  Pulmonary:     Effort: Pulmonary effort is normal. No respiratory distress.     Breath sounds: Normal breath sounds.  Abdominal:     General: Abdomen is flat. There is no distension.     Palpations: Abdomen is soft.     Tenderness: There is abdominal tenderness (Very mild, right sided) in the right upper quadrant and right lower quadrant. There is no guarding or rebound.  Genitourinary:    Comments: Deferred Skin:    General: Skin is warm and dry.     Coloration: Skin is not jaundiced or pale.  Neurological:     General: No focal deficit present.     Mental Status: She is alert and oriented to person, place, and time.  Psychiatric:        Mood and Affect: Mood normal.         Behavior: Behavior normal.      Labs:  CBC Latest Ref Rng & Units 09/26/2019  WBC 4.0 - 10.5 K/uL 13.4(H)  Hemoglobin 12.0 - 15.0 g/dL 15.3(H)  Hematocrit 36.0 - 46.0 % 45.7  Platelets 150 - 400 K/uL 456(H)   CMP Latest Ref Rng & Units 09/26/2019  Glucose 70 - 99 mg/dL 11/26/2019)  BUN 6 - 20 mg/dL 16  Creatinine 419(Q - 2.22 mg/dL 9.79  Sodium 8.92 - 119 mmol/L 138  Potassium 3.5 - 5.1 mmol/L 4.0  Chloride 98 - 111 mmol/L 102  CO2 22 - 32 mmol/L 27  Calcium 8.9 - 10.3 mg/dL 8.9  Total Protein 6.5 - 8.1  g/dL 7.9  Total Bilirubin 0.3 - 1.2 mg/dL 0.7  Alkaline Phos 38 - 126 U/L 63  AST 15 - 41 U/L 20  ALT 0 - 44 U/L 14     Imaging studies:   CT Abdomen/Pelvis (09/27/2019) personally reviewed showing small bowel dilation and I do feel there is an area of thickening in the mid-distal ileum just distal to the transition point, and radiologist report reviewed:  IMPRESSION: Partial small bowel obstruction with transition in the mid abdomen.   Assessment/Plan: (ICD-10's: K70.609) 34 y.o. female with abdominal pain, nausea, and emesis with small bowel dilation on CT concerning for small bowel obstruction and I do feel there is an area of small bowel thickening just distal to the transition point without any previous surgeries making adhesive disease unlikely.   - Appreciate medicine admission; we will follow  - Agree with NGT decompression; low-intermittent suction; monitor output   - Morning KUB  - Recommend GI consultation for possible IBD  - No emergent surgical intervention  - Continue IVF  - Pain control prn; antiemetics prn   - mobilization   All of the above findings and recommendations were discussed with the patient, and all of patient's questions were answered to her expressed satisfaction.  Thank you for the opportunity to participate in this patient's care.   -- Edison Simon, PA-C [facility] 09/27/2019, 2:06 PM [phone] M-F: 7am - 4pm

## 2019-09-27 NOTE — ED Notes (Signed)
Pt to CT

## 2019-09-27 NOTE — Progress Notes (Signed)
Patient currently resting on stretcher, c/o sore throat but otherwise no issues, chloraseptic spray ordered and given, see MAR. NG tube to low intermittent suction.

## 2019-09-27 NOTE — ED Notes (Signed)
Report attempted to [facility] RN

## 2019-09-27 NOTE — Progress Notes (Signed)
[facility]  - [place] at [facility]   PATIENT NAME: Ann Gibson    MR#:  [id number]  DATE OF BIRTH:  [birth year]  SUBJECTIVE:  complains of NG tube bothering her. So far 150 mL output through NG not passing gas denies abdominal pain REVIEW OF SYSTEMS:   Review of Systems  Constitutional: Negative for chills, fever and weight loss.  HENT: Positive for sore throat. Negative for ear discharge, ear pain and nosebleeds.   Eyes: Negative for blurred vision, pain and discharge.  Respiratory: Negative for sputum production, shortness of breath, wheezing and stridor.   Cardiovascular: Negative for chest pain, palpitations, orthopnea and PND.  Gastrointestinal: Negative for abdominal pain, diarrhea, nausea and vomiting.  Genitourinary: Negative for frequency and urgency.  Musculoskeletal: Negative for back pain and joint pain.  Neurological: Negative for sensory change, speech change, focal weakness and weakness.  Psychiatric/Behavioral: Negative for depression and hallucinations. The patient is not nervous/anxious.    Tolerating Diet:npo Tolerating PT:   DRUG ALLERGIES:   Allergies  Allergen Reactions  . Sulfa Antibiotics Swelling    VITALS:  Blood pressure 123/71, pulse 77, temperature 98.7 F (37.1 C), temperature source Oral, resp. rate 20, height 5\' 4"  (1.626 m), weight 68 kg, SpO2 96 %.  PHYSICAL EXAMINATION:   Physical Exam  GENERAL:  34 y.o.-year-old patient lying in the bed with no acute distress.  EYES: Pupils equal, round, reactive to light and accommodation. No scleral icterus.   HEENT: Head atraumatic, normocephalic. Oropharynx and nasopharynx clear.  NG+ NECK:  Supple, no jugular venous distention. No thyroid enlargement, no tenderness.  LUNGS: Normal breath sounds bilaterally, no wheezing, rales, rhonchi. No use of accessory muscles of respiration.  CARDIOVASCULAR: S1, S2 normal. No murmurs, rubs, or gallops.  ABDOMEN: Soft, nontender,  nondistended. Few Bowel sounds present. No organomegaly or mass.  EXTREMITIES: No cyanosis, clubbing or edema b/l.    NEUROLOGIC: Cranial nerves II through XII are intact. No focal Motor or sensory deficits b/l.   PSYCHIATRIC:  patient is alert and oriented x 3.  SKIN: No obvious rash, lesion, or ulcer.   LABORATORY PANEL:  CBC Recent Labs  Lab 09/26/19 2157  WBC 13.4*  HGB 15.3*  HCT 45.7  PLT 456*    Chemistries  Recent Labs  Lab 09/26/19 2157  NA 138  K 4.0  CL 102  CO2 27  GLUCOSE 121*  BUN 16  CREATININE 0.92  CALCIUM 8.9  AST 20  ALT 14  ALKPHOS 63  BILITOT 0.7   Cardiac Enzymes No results for input(s): TROPONINI in the last 168 hours. RADIOLOGY:  CT ABDOMEN PELVIS W CONTRAST  Result Date: 09/27/2019 CLINICAL DATA:  Nausea and vomiting with right lower quadrant pain EXAM: CT ABDOMEN AND PELVIS WITH CONTRAST TECHNIQUE: Multidetector CT imaging of the abdomen and pelvis was performed using the standard protocol following bolus administration of intravenous contrast. CONTRAST:  11/27/2019 OMNIPAQUE IOHEXOL 300 MG/ML  SOLN COMPARISON:  None. FINDINGS: Lower chest:  No contributory findings. Hepatobiliary: No focal liver abnormality.No evidence of biliary obstruction or stone. Pancreas: Unremarkable. Spleen: Unremarkable. Adrenals/Urinary Tract: Negative adrenals. No hydronephrosis or stone. Unremarkable bladder. Stomach/Bowel: Small bowel loops are fluid-filled and dilated proximally and decompressed distally. There is a transition in the central abdomen without underlying twist, wall thickening, or mass. The mesentery of the dilated loops is mildly edematous. Negative colon. No appendicitis. Vascular/Lymphatic: No acute vascular abnormality. No mass or adenopathy. Reproductive:Negative. Other: Trace pelvic ascites considered reactive. Musculoskeletal: No acute abnormalities.  IMPRESSION: Partial small bowel obstruction with transition in the mid abdomen. No visible underlying  cause, has there been prior abdominal surgery? Electronically Signed   By: Monte Fantasia M.D.   On: 09/27/2019 05:39   DG Abd 2 Views  Result Date: 09/27/2019 CLINICAL DATA:  Check gastric catheter placement EXAM: ABDOMEN - 2 VIEW COMPARISON:  CT from earlier in the same day FINDINGS: Gastric catheter is noted within the stomach. Proximal side port is seen at the gastroesophageal junction. This could be advanced a few cm further into the stomach. Fecal material is noted throughout the colon. The dilated loops of small bowel are not as well appreciated on these images likely related to their fluid-filled nature. No free air is seen. Contrast from prior CT examination is noted. IMPRESSION: Gastric catheter as described. This could be advanced a few cm further into the stomach. Retained fecal material within the colon. The known small-bowel dilation is not well appreciated due to the fluid-filled nature of the bowel loops. Electronically Signed   By: Inez Catalina M.D.   On: 09/27/2019 16:31   US ABDOMEN LIMITED RUQ  Result Date: 09/27/2019 CLINICAL DATA:  Initial evaluation for episodic epigastric and right upper quadrant pain for 1 week with nausea and vomiting. EXAM: ULTRASOUND ABDOMEN LIMITED RIGHT UPPER QUADRANT COMPARISON:  None available. FINDINGS: Gallbladder: No gallstones or wall thickening visualized. No sonographic Murphy sign noted by sonographer. Common bile duct: Diameter: 4.9 mm Liver: No focal lesion identified. Within normal limits in parenchymal echogenicity. Portal vein is patent on color Doppler imaging with normal direction of blood flow towards the liver. Other: None. IMPRESSION: Normal right upper quadrant ultrasound. Electronically Signed   By: Jeannine Boga M.D.   On: 09/27/2019 04:23   ASSESSMENT AND PLAN:  Ann Gibson  is a 34 y.o. pleasant Caucasian female with a known history of suspected IBS, who presented to the emergency room with acute onset of intermittent  abdominal pain for the last week with associated nausea and vomiting since early this morning.  She denied any diarrhea.  1.  small bowel obstruction. -She will be kept n.p.o. -continue NG tube to low intermittent suction. - will optimize her potassium and electrolytes. -Pain management will be provided. -IV PPI therapy will be provided. -general surgery consultation with Dr. Hampton Abbot. --follow further recommendations -seen by G.I. Dr. Vicente Males-- further evaluation for IBS recommends MRI enteroscopy  2.  Suspected history of IBS. -GI consultation with Dr. Vicente Males --appreciate input  3.  DVT prophylaxis. -Subcutaneous Lovenox  Procedures: NG tube Family communication :none today Consults :Surgery, GI Discharge Disposition : patient is from home likely discharge home once small bowel obstruction resolves CODE STATUS: FULL DVT Prophylaxis :Lovenox   TOTAL TIME TAKING CARE OF THIS PATIENT: *30* minutes.  >50% time spent on counselling and coordination of care  Note: This dictation was prepared with Dragon dictation along with smaller phrase technology. Any transcriptional errors that result from this process are unintentional.  Fritzi Mandes M.D    [facility]   CC: Primary care physician; System, Pcp Not InPatient ID: Ann Gibson, female   DOB: [birth year], 34 y.o.   MRN: [id number]

## 2019-09-28 ENCOUNTER — Inpatient Hospital Stay: Payer: Managed Care, Other (non HMO)

## 2019-09-28 LAB — CBC
HCT: 38.2 % (ref 36.0–46.0)
Hemoglobin: 12.7 g/dL (ref 12.0–15.0)
MCH: 30 pg (ref 26.0–34.0)
MCHC: 33.2 g/dL (ref 30.0–36.0)
MCV: 90.1 fL (ref 80.0–100.0)
Platelets: 366 K/uL (ref 150–400)
RBC: 4.24 MIL/uL (ref 3.87–5.11)
RDW: 12.5 % (ref 11.5–15.5)
WBC: 10 K/uL (ref 4.0–10.5)
nRBC: 0 % (ref 0.0–0.2)

## 2019-09-28 LAB — BASIC METABOLIC PANEL
Anion gap: 6 (ref 5–15)
BUN: 10 mg/dL (ref 6–20)
CO2: 24 mmol/L (ref 22–32)
Calcium: 8.2 mg/dL — ABNORMAL LOW (ref 8.9–10.3)
Chloride: 107 mmol/L (ref 98–111)
Creatinine, Ser: 0.89 mg/dL (ref 0.44–1.00)
GFR calc Af Amer: >60 mL/min (ref >60)
GFR calc non Af Amer: >60 mL/min (ref >60)
Glucose, Bld: 95 mg/dL (ref 70–99)
Potassium: 3.8 mmol/L (ref 3.5–5.1)
Sodium: 137 mmol/L (ref 135–145)

## 2019-09-28 LAB — C-REACTIVE PROTEIN: CRP: 0.5 mg/dL

## 2019-09-28 LAB — TSH: TSH: 4.036 u[IU]/mL (ref 0.350–4.500)

## 2019-09-28 LAB — HIV ANTIBODY (ROUTINE TESTING W REFLEX): HIV Screen 4th Generation wRfx: NONREACTIVE

## 2019-09-28 MED ORDER — PANTOPRAZOLE SODIUM 40 MG PO TBEC
40.0000 mg | DELAYED_RELEASE_TABLET | Freq: Every day | ORAL | Status: DC
  Administered 2019-09-28: 40 mg via ORAL
  Filled 2019-09-28 (×2): qty 1

## 2019-09-28 MED ORDER — SIMETHICONE 80 MG PO CHEW
160.0000 mg | CHEWABLE_TABLET | Freq: Four times a day (QID) | ORAL | Status: DC | PRN
  Administered 2019-09-28: 160 mg via ORAL
  Filled 2019-09-28: qty 2

## 2019-09-28 MED ORDER — MENTHOL 3 MG MT LOZG
1.0000 | LOZENGE | OROMUCOSAL | Status: DC | PRN
  Filled 2019-09-28 (×2): qty 9

## 2019-09-28 NOTE — Progress Notes (Addendum)
[name] , MD [address] [place], [place], [place], [address] [address], [place], [place], [place], [address] Phone: [phone]  Fax: [phone]   Ann Gibson is being followed for partial small bowel obstruction day 1 of follow up   Subjective: She feels much better today.  NG tube is out.  Passing gas and had a bowel movement.  Tolerating oral intake.   Objective: Vital signs in last 24 hours: Vitals:   09/27/19 1916 09/27/19 2018 09/27/19 2319 09/28/19 0428  BP: 124/83 120/66 123/75 130/66  Pulse: 73 (!) 56 68 63  Resp: 18 18 18 18   Temp: 98.4 F (36.9 C) 98.2 F (36.8 C) 97.8 F (36.6 C) 98.4 F (36.9 C)  TempSrc: Oral Oral Oral Oral  SpO2: 99% 98% 98% 97%  Weight:  70.4 kg    Height:  5\' 4"  (1.626 m)     Weight change: 2.36 kg  Intake/Output Summary (Last 24 hours) at 09/28/2019 1059 Last data filed at 09/28/2019 0600 Gross per 24 hour  Intake --  Output 350 ml  Net -350 ml     Exam: Heart:: Regular rate and rhythm, S1S2 present or without murmur or extra heart sounds Lungs: normal, clear to auscultation and clear to auscultation and percussion Abdomen: soft, nontender, normal bowel sounds   Lab Results: @LABTEST2 @ Micro Results: Recent Results (from the past 240 hour(s))  SARS CORONAVIRUS 2 (TAT 6-24 HRS) Nasopharyngeal Nasopharyngeal Swab     Status: None   Collection Time: 09/27/19  7:23 AM   Specimen: Nasopharyngeal Swab  Result Value Ref Range Status   SARS Coronavirus 2 NEGATIVE NEGATIVE Final    Comment: (NOTE) SARS-CoV-2 target nucleic acids are NOT DETECTED. The SARS-CoV-2 RNA is generally detectable in upper and lower respiratory specimens during the acute phase of infection. Negative results do not preclude SARS-CoV-2 infection, do not rule out co-infections with other pathogens, and should not be used as the sole basis for treatment or other patient management decisions. Negative results must be combined with clinical  observations, patient history, and epidemiological information. The expected result is Negative. Fact Sheet for Patients: SugarRoll.be Fact Sheet for Healthcare Providers: https://www.woods-mathews.com/ This test is not yet approved or cleared by the [place] FDA and  has been authorized for detection and/or diagnosis of SARS-CoV-2 by FDA under an Emergency Use Authorization (EUA). This EUA will remain  in effect (meaning this test can be used) for the duration of the COVID-19 declaration under Section 56 4(b)(1) of the Act, 21 U.S.C. section 360bbb-3(b)(1), unless the authorization is terminated or revoked sooner. Performed at [facility], [place] [address] [place], [place] [address]    Studies/Results: DG Abd 1 View  Result Date: 09/28/2019 CLINICAL DATA:  Small-bowel obstruction. EXAM: ABDOMEN - 1 VIEW COMPARISON:  09/27/2019 FINDINGS: The NG tube is stable. The bowel gas pattern is unremarkable. No findings for small bowel obstruction or free air. Scattered stool in the colon. The soft tissue shadows are maintained. Density in the pelvis is likely some type of vaginal birth control ring. IMPRESSION: Stable NG tube. No findings for small bowel obstruction or free air. Electronically Signed   By: Marijo Sanes M.D.   On: 09/28/2019 08:52   CT ABDOMEN PELVIS W CONTRAST  Result Date: 09/27/2019 CLINICAL DATA:  Nausea and vomiting with right lower quadrant pain EXAM: CT ABDOMEN AND PELVIS WITH CONTRAST TECHNIQUE: Multidetector CT imaging of the abdomen and pelvis was performed using the standard protocol following  bolus administration of intravenous contrast. CONTRAST:  OMNIPAQUE IOHEXOL 300 MG/ML  SOLN COMPARISON:  None. FINDINGS: Lower chest:  No contributory findings. Hepatobiliary: No focal liver abnormality.No evidence of biliary obstruction or stone. Pancreas: Unremarkable. Spleen: Unremarkable. Adrenals/Urinary Tract: Negative  adrenals. No hydronephrosis or stone. Unremarkable bladder. Stomach/Bowel: Small bowel loops are fluid-filled and dilated proximally and decompressed distally. There is a transition in the central abdomen without underlying twist, wall thickening, or mass. The mesentery of the dilated loops is mildly edematous. Negative colon. No appendicitis. Vascular/Lymphatic: No acute vascular abnormality. No mass or adenopathy. Reproductive:Negative. Other: Trace pelvic ascites considered reactive. Musculoskeletal: No acute abnormalities. IMPRESSION: Partial small bowel obstruction with transition in the mid abdomen. No visible underlying cause, has there been prior abdominal surgery? Electronically Signed   By: Marnee Spring M.D.   On: 09/27/2019 05:39   DG Abd 2 Views  Result Date: 09/27/2019 CLINICAL DATA:  Check gastric catheter placement EXAM: ABDOMEN - 2 VIEW COMPARISON:  CT from earlier in the same day FINDINGS: Gastric catheter is noted within the stomach. Proximal side port is seen at the gastroesophageal junction. This could be advanced a few cm further into the stomach. Fecal material is noted throughout the colon. The dilated loops of small bowel are not as well appreciated on these images likely related to their fluid-filled nature. No free air is seen. Contrast from prior CT examination is noted. IMPRESSION: Gastric catheter as described. This could be advanced a few cm further into the stomach. Retained fecal material within the colon. The known small-bowel dilation is not well appreciated due to the fluid-filled nature of the bowel loops. Electronically Signed   By: Alcide Clever M.D.   On: 09/27/2019 16:31   US ABDOMEN LIMITED RUQ  Result Date: 09/27/2019 CLINICAL DATA:  Initial evaluation for episodic epigastric and right upper quadrant pain for 1 week with nausea and vomiting. EXAM: ULTRASOUND ABDOMEN LIMITED RIGHT UPPER QUADRANT COMPARISON:  None available. FINDINGS: Gallbladder: No gallstones or wall  thickening visualized. No sonographic Murphy sign noted by sonographer. Common bile duct: Diameter: 4.9 mm Liver: No focal lesion identified. Within normal limits in parenchymal echogenicity. Portal vein is patent on color Doppler imaging with normal direction of blood flow towards the liver. Other: None. IMPRESSION: Normal right upper quadrant ultrasound. Electronically Signed   By: Rise Mu M.D.   On: 09/27/2019 04:23   Medications: I have reviewed the patient's current medications. Scheduled Meds: . enoxaparin (LOVENOX) injection  40 mg Subcutaneous Q24H  . pantoprazole (PROTONIX) IV  40 mg Intravenous Q12H   Continuous Infusions: . 0.9 % NaCl with KCl 20 mEq / L 100 mL/hr at 09/28/19 0908   PRN Meds:.acetaminophen **OR** acetaminophen, menthol-cetylpyridinium, morphine injection, ondansetron **OR** ondansetron (ZOFRAN) IV, phenol, traZODone   Assessment: Active Problems:   SBO (small bowel obstruction) (HCC)  Ann Gibson is a 34 y.o. y/o female presents to the emergency room with abdominal pain.  CT scan of the abdomen shows small bowel obstruction with a transition point.  No clear obstruction is seen within the lumen.?  Adhesions.  She also carries a diagnosis of irritable bowel syndrome for which I have been consulted.  She has been seen by Dr. Aleen Campi and surgery.From the GI point of view irritable bowel syndrome is not a condition that needs to be addressed urgently as an inpatient.  It would be probably best addressed as an outpatient once she is past the acute phase of of the bowel obstruction.  She has been on long-term NSAIDs and is very likely at times NSAIDs can cause stenosis/narrowing/strictures.  She is doing well after the NG tube has been taken out.  I would suggest to advance diet gradually and consider obtaining a CT enterography or an MR enterography.  If not able to obtain in a timely manner before discharged and can be done as an outpatient.   Inflammation/stenosis/strictures which can at times be seen due to NSAID all in Crohn's disease.  Subsequent follow-up can be done as an outpatient.  I do note that the CRP has been normal hence making significant active inflammation less likely if not impossible  I will sign off please call with any questions  LOS: 1 day   Wyline Mood, MD 09/28/2019, 10:59 AM

## 2019-09-28 NOTE — Progress Notes (Signed)
[facility] SURGICAL PROGRESS NOTE (cpt [phone])  Hospital Day(s): 1.   Interval History: Patient seen and examined, no acute events or new complaints overnight. Patient reports she is feeling a little better this morning. Abdominal pain improved. No fever, chills, nausea, or emesis. Labs are reassuring this morning. NGT output in last 24 hours recorded as 350 ccs. She does report that she is passing flatus, no bowel movement.   Review of Systems:  Constitutional: denies fever, chills  HEENT: denies cough or congestion  Respiratory: denies any shortness of breath  Cardiovascular: denies chest pain or palpitations  Gastrointestinal: denies abdominal pain, N/V, or diarrhea/and bowel function as per interval history Genitourinary: denies burning with urination or urinary frequency   Vital signs in last 24 hours: [min-max] current  Temp:  [97.8 F (36.6 C)-98.7 F (37.1 C)] 98.4 F (36.9 C) (04/09 0428) Pulse Rate:  [46-96] 63 (04/09 0428) Resp:  [12-25] 18 (04/09 0428) BP: (98-130)/(64-90) 130/66 (04/09 0428) SpO2:  [96 %-99 %] 97 % (04/09 0428) Weight:  [70.4 kg] 70.4 kg (04/08 2018)     Height: 5\' 4"  (162.6 cm) Weight: 70.4 kg BMI (Calculated): 26.63   Intake/Output last 2 shifts:  04/08 0701 - 04/09 0700 In: -  Out: 350 [Emesis/NG output:350]   Physical Exam:  Constitutional: alert, cooperative and no distress  HENT: normocephalic without obvious abnormality, NGT in place  Eyes: PERRL, EOM's grossly intact and symmetric  Respiratory: breathing non-labored at rest  Cardiovascular: regular rate and sinus rhythm  Gastrointestinal: soft, non-tender, and non-distended, no rebound, guarding Musculoskeletal: no edema or wounds, motor and sensation grossly intact, NT    Labs:  CBC Latest Ref Rng & Units 09/28/2019 09/26/2019  WBC 4.0 - 10.5 K/uL 10.0 13.4(H)  Hemoglobin 12.0 - 15.0 g/dL 11/26/2019 15.3(H)  Hematocrit 36.0 - 46.0 % 38.2 45.7  Platelets 150 - 400 K/uL 366  456(H)   CMP Latest Ref Rng & Units 09/28/2019 09/26/2019  Glucose 70 - 99 mg/dL 95 11/26/2019)  BUN 6 - 20 mg/dL 10 16  Creatinine 938(H - 1.00 mg/dL 8.29 9.37  Sodium 1.69 - 145 mmol/L 137 138  Potassium 3.5 - 5.1 mmol/L 3.8 4.0  Chloride 98 - 111 mmol/L 107 102  CO2 22 - 32 mmol/L 24 27  Calcium 8.9 - 10.3 mg/dL 8.2(L) 8.9  Total Protein 6.5 - 8.1 g/dL - 7.9  Total Bilirubin 0.3 - 1.2 mg/dL - 0.7  Alkaline Phos 38 - 126 U/L - 63  AST 15 - 41 U/L - 20  ALT 0 - 44 U/L - 14     Imaging studies:  KUB (09/28/2019) personally reviewed showing gas in the colon and decrease in air filled small bowel loops, and radiologist report reviewed:  Stable NG tube.  No findings for small bowel obstruction or free air.   Assessment/Plan: (ICD-10's: K54.609) 34 y.o. female with likely resolved small bowel obstruction felt attributable to possible IBD/IBS with some degree of small bowel thickening just distal to the transition point without any previous surgeries making adhesive disease unlikely   - will preform clamping trial this morning, check residuals after 4 hours, if less than 150 ccs residual will remove NGT  - If NGT out today, can initiate bowel regimen   - Appreciate GI recommendations   - No emergent surgical intervention             - Continue IVF             - Pain  control prn; antiemetics prn              - mobilization   - further management per primary team; we will follow   All of the above findings and recommendations were discussed with the patient, and the medical team, and all of patient's questions were answered to her expressed satisfaction.  -- Edison Simon, PA-C [facility] 09/28/2019, 7:10 AM [phone] M-F: 7am - 4pm

## 2019-09-28 NOTE — Progress Notes (Signed)
Patient did not have any output from NG during clamp trial. NG removed. Patient started on clear liquid diet per previous order from [facility]

## 2019-09-28 NOTE — Progress Notes (Signed)
Ann Gibson at [place] NAME: Ann Gibson    MR#:  [id number]  DATE OF BIRTH:  August 09, 1985  SUBJECTIVE:  patient very happy and excited since NG tube removed. She is tolerating clear liquid diet. Passing flatus.  REVIEW OF SYSTEMS:   Review of Systems  Constitutional: Negative for chills, fever and weight loss.  HENT: Positive for sore throat. Negative for ear discharge, ear pain and nosebleeds.   Eyes: Negative for blurred vision, pain and discharge.  Respiratory: Negative for sputum production, shortness of breath, wheezing and stridor.   Cardiovascular: Negative for chest pain, palpitations, orthopnea and PND.  Gastrointestinal: Negative for abdominal pain, diarrhea, nausea and vomiting.  Genitourinary: Negative for frequency and urgency.  Musculoskeletal: Negative for back pain and joint pain.  Neurological: Negative for sensory change, speech change, focal weakness and weakness.  Psychiatric/Behavioral: Negative for depression and hallucinations. The patient is not nervous/anxious.    Tolerating Diet: clear liquid Tolerating PT: ambulatory  DRUG ALLERGIES:   Allergies  Allergen Reactions  . Sulfa Antibiotics Swelling    VITALS:  Blood pressure 131/67, pulse 73, temperature 98.7 F (37.1 C), temperature source Oral, resp. rate 16, height 5\' 4"  (1.626 m), weight 70.4 kg, last menstrual period 09/27/2019, SpO2 100 %.  PHYSICAL EXAMINATION:   Physical Exam  GENERAL:  34 y.o.-year-old patient lying in the bed with no acute distress.  EYES: Pupils equal, round, reactive to light and accommodation. No scleral icterus.   HEENT: Head atraumatic, normocephalic. Oropharynx and nasopharynx clear.  NECK:  Supple, no jugular venous distention. No thyroid enlargement, no tenderness.  LUNGS: Normal breath sounds bilaterally, no wheezing, rales, rhonchi. No use of accessory muscles of respiration.  CARDIOVASCULAR: S1, S2 normal. No murmurs,  rubs, or gallops.  ABDOMEN: Soft, nontender, nondistended. Few Bowel sounds present. No organomegaly or mass.  EXTREMITIES: No cyanosis, clubbing or edema b/l.    NEUROLOGIC: Cranial nerves II through XII are intact. No focal Motor or sensory deficits b/l.   PSYCHIATRIC:  patient is alert and oriented x 3.  SKIN: No obvious rash, lesion, or ulcer.   LABORATORY PANEL:  CBC Recent Labs  Lab 09/28/19 0437  WBC 10.0  HGB 12.7  HCT 38.2  PLT 366    Chemistries  Recent Labs  Lab 09/26/19 2157 09/26/19 2157 09/28/19 0437  NA 138   < > 137  K 4.0   < > 3.8  CL 102   < > 107  CO2 27   < > 24  GLUCOSE 121*   < > 95  BUN 16   < > 10  CREATININE 0.92   < > 0.89  CALCIUM 8.9   < > 8.2*  AST 20  --   --   ALT 14  --   --   ALKPHOS 63  --   --   BILITOT 0.7  --   --    < > = values in this interval not displayed.   Cardiac Enzymes No results for input(s): TROPONINI in the last 168 hours. RADIOLOGY:  DG Abd 1 View  Result Date: 09/28/2019 CLINICAL DATA:  Small-bowel obstruction. EXAM: ABDOMEN - 1 VIEW COMPARISON:  09/27/2019 FINDINGS: The NG tube is stable. The bowel gas pattern is unremarkable. No findings for small bowel obstruction or free air. Scattered stool in the colon. The soft tissue shadows are maintained. Density in the pelvis is likely some type of vaginal birth control ring. IMPRESSION: Stable NG  tube. No findings for small bowel obstruction or free air. Electronically Signed   By: Rudie Meyer M.D.   On: 09/28/2019 08:52   CT ABDOMEN PELVIS W CONTRAST  Result Date: 09/27/2019 CLINICAL DATA:  Nausea and vomiting with right lower quadrant pain EXAM: CT ABDOMEN AND PELVIS WITH CONTRAST TECHNIQUE: Multidetector CT imaging of the abdomen and pelvis was performed using the standard protocol following bolus administration of intravenous contrast. CONTRAST:  OMNIPAQUE IOHEXOL 300 MG/ML  SOLN COMPARISON:  None. FINDINGS: Lower chest:  No contributory findings. Hepatobiliary:  No focal liver abnormality.No evidence of biliary obstruction or stone. Pancreas: Unremarkable. Spleen: Unremarkable. Adrenals/Urinary Tract: Negative adrenals. No hydronephrosis or stone. Unremarkable bladder. Stomach/Bowel: Small bowel loops are fluid-filled and dilated proximally and decompressed distally. There is a transition in the central abdomen without underlying twist, wall thickening, or mass. The mesentery of the dilated loops is mildly edematous. Negative colon. No appendicitis. Vascular/Lymphatic: No acute vascular abnormality. No mass or adenopathy. Reproductive:Negative. Other: Trace pelvic ascites considered reactive. Musculoskeletal: No acute abnormalities. IMPRESSION: Partial small bowel obstruction with transition in the mid abdomen. No visible underlying cause, has there been prior abdominal surgery? Electronically Signed   By: Marnee Spring M.D.   On: 09/27/2019 05:39   DG Abd 2 Views  Result Date: 09/27/2019 CLINICAL DATA:  Check gastric catheter placement EXAM: ABDOMEN - 2 VIEW COMPARISON:  CT from earlier in the same day FINDINGS: Gastric catheter is noted within the stomach. Proximal side port is seen at the gastroesophageal junction. This could be advanced a few cm further into the stomach. Fecal material is noted throughout the colon. The dilated loops of small bowel are not as well appreciated on these images likely related to their fluid-filled nature. No free air is seen. Contrast from prior CT examination is noted. IMPRESSION: Gastric catheter as described. This could be advanced a few cm further into the stomach. Retained fecal material within the colon. The known small-bowel dilation is not well appreciated due to the fluid-filled nature of the bowel loops. Electronically Signed   By: Alcide Clever M.D.   On: 09/27/2019 16:31   US ABDOMEN LIMITED RUQ  Result Date: 09/27/2019 CLINICAL DATA:  Initial evaluation for episodic epigastric and right upper quadrant pain for 1 week  with nausea and vomiting. EXAM: ULTRASOUND ABDOMEN LIMITED RIGHT UPPER QUADRANT COMPARISON:  None available. FINDINGS: Gallbladder: No gallstones or wall thickening visualized. No sonographic Murphy sign noted by sonographer. Common bile duct: Diameter: 4.9 mm Liver: No focal lesion identified. Within normal limits in parenchymal echogenicity. Portal vein is patent on color Doppler imaging with normal direction of blood flow towards the liver. Other: None. IMPRESSION: Normal right upper quadrant ultrasound. Electronically Signed   By: Rise Mu M.D.   On: 09/27/2019 04:23   ASSESSMENT AND PLAN:  Frankye Schwegel  is a 34 y.o. pleasant Caucasian female with a known history of suspected IBS, who presented to the emergency room with acute onset of intermittent abdominal pain for the last week with associated nausea and vomiting since early this morning.  She denied any diarrhea.  1.  small bowel obstruction. -NG tube removed per surgery. - will optimize her potassium and electrolytes. -Pain management will be provide -general surgery consultation with Dr. Aleen Campi. --follow further recommendations -seen by G.I. Dr. Tobi Bastos-- further evaluation for IBS recommends MRI enteroscopy as outpatient. -Clear liquid diet -ambulated in the hallways  2.  Suspected history of IBS. -GI consultation with Dr. Tobi Bastos --appreciate input-- up  further workup if needed as outpatient  3.  DVT prophylaxis. -Subcutaneous Lovenox  Procedures: NG tube Family communication :none today Consults :Surgery, GI Discharge Disposition : patient is from home likely discharge home once small bowel obstruction resolves. NG tube removed today. Clear liquid started. Once diet advanced to full liquid likely patient will discharged tomorrow CODE STATUS: FULL DVT Prophylaxis :Lovenox   TOTAL TIME TAKING CARE OF THIS PATIENT: *25* minutes.  >50% time spent on counselling and coordination of care  Note: This dictation was  prepared with Dragon dictation along with smaller phrase technology. Any transcriptional errors that result from this process are unintentional.  Enedina Finner M.D    [facility]   CC: Primary care physician; System, Pcp Not InPatient ID: Rosalva Ferron, female   DOB: March 18, 1986, 34 y.o.   MRN: [id number]

## 2019-09-29 MED ORDER — DOCUSATE SODIUM 100 MG PO CAPS
200.0000 mg | ORAL_CAPSULE | Freq: Two times a day (BID) | ORAL | Status: DC
  Administered 2019-09-29: 200 mg via ORAL
  Filled 2019-09-29: qty 2

## 2019-09-29 NOTE — Discharge Summary (Signed)
[facility] at [place] NAME: Ann Gibson    MR#:  [id number]  DATE OF BIRTH:  28-Sep-1985  DATE OF ADMISSION:  09/27/2019 ADMITTING PHYSICIAN: Christel Mormon, MD  DATE OF DISCHARGE: 09/29/2019  PRIMARY CARE PHYSICIAN: System, Pcp Not In    ADMISSION DIAGNOSIS:  SBO (small bowel obstruction) ([facility]) [K56.609] Partial small bowel obstruction ([place]) [K56.600]  DISCHARGE DIAGNOSIS:  Partial SBO/Ileus--improving with conservative management  SECONDARY DIAGNOSIS:  History reviewed. No pertinent past medical history.  HOSPITAL COURSE:  suspected IBS, who presented to the emergency room with acute onset of intermittent abdominal pain for the last week with associated nausea and vomiting since early this morning. She denied any diarrhea.  1. small bowel obstruction/Ileus--improving. -NG tube removed per surgery. -Stable electrolyte -Pain management will be provide -general surgery consultation with Drs. Piscoya/ Pavone --follow further recommendations -seen by G.I. Dr. Vicente Males-- further evaluation for IBS recommends MRI enteroscopy as outpatient. -Clear liquid diet-- tolerating full liquid-- advance to soft diet -ambulated in the hallways -patient feels a lot better. -Came from surgery standpoint to discharge. Patient agreeable.  2. Suspected history of IBS. -GI consultation with Dr. Vicente Males --appreciate input-- up further workup if needed as outpatient  3. DVT prophylaxis. -Subcutaneous Lovenox  Procedures: none  family communication : family in the room Consults :Surgery, GI Discharge Disposition : patient is from home  discharge today after patient tolerates soft diet CODE STATUS: FULL DVT Prophylaxis :Lovenox   CONSULTS OBTAINED:  Treatment Team:  Olean Ree, MD  DRUG ALLERGIES:   Allergies  Allergen Reactions  . Sulfa Antibiotics Swelling    DISCHARGE MEDICATIONS:   Allergies as of 09/29/2019      Reactions   Sulfa  Antibiotics Swelling      Medication List    TAKE these medications   Creatine Monohydrate Powd Take 0.5 g by mouth daily.   EXCEDRIN PO Take 2 tablets by mouth every 6 (six) hours as needed.   GLUTAMINE PO Take 1 each by mouth every morning.   ibuprofen 200 MG tablet Commonly known as: ADVIL Take 200-400 mg by mouth every 6 (six) hours as needed for headache.   multivitamin capsule Take 1 capsule by mouth daily.   PROBIOTIC-10 PO Take 1 capsule by mouth daily.   SAM-e 200 MG Tabs Take 400 mg by mouth daily.       If you experience worsening of your admission symptoms, develop shortness of breath, life threatening emergency, suicidal or homicidal thoughts you must seek medical attention immediately by calling 911 or calling your MD immediately  if symptoms less severe.  You Must read complete instructions/literature along with all the possible adverse reactions/side effects for all the Medicines you take and that have been prescribed to you. Take any new Medicines after you have completely understood and accept all the possible adverse reactions/side effects.   Please note  You were cared for by a hospitalist during your hospital stay. If you have any questions about your discharge medications or the care you received while you were in the hospital after you are discharged, you can call the unit and asked to speak with the hospitalist on call if the hospitalist that took care of you is not available. Once you are discharged, your primary care physician will handle any further medical issues. Please note that NO REFILLS for any discharge medications will be authorized once you are discharged, as it is imperative that you return to your primary  care physician (or establish a relationship with a primary care physician if you do not have one) for your aftercare needs so that they can reassess your need for medications and monitor your lab values. Today   SUBJECTIVE   Feels  better. Tolerating full liquid diet. No BM. Patient passing flatus. Ambulated in the hallways VITAL SIGNS:  Blood pressure (!) 135/57, pulse 70, temperature 98 F (36.7 C), temperature source Oral, resp. rate 16, height 5\' 4"  (1.626 m), weight 70.4 kg, last menstrual period 09/27/2019, SpO2 99 %.  I/O:    Intake/Output Summary (Last 24 hours) at 09/29/2019 1246 Last data filed at 09/29/2019 1035 Gross per 24 hour  Intake 679.93 ml  Output --  Net 679.93 ml    PHYSICAL EXAMINATION:  GENERAL:  34 y.o.-year-old patient lying in the bed with no acute distress.  EYES: Pupils equal, round, reactive to light and accommodation. No scleral icterus.  HEENT: Head atraumatic, normocephalic. Oropharynx and nasopharynx clear.  NECK:  Supple, no jugular venous distention. No thyroid enlargement, no tenderness.  LUNGS: Normal breath sounds bilaterally, no wheezing, rales,rhonchi or crepitation. No use of accessory muscles of respiration.  CARDIOVASCULAR: S1, S2 normal. No murmurs, rubs, or gallops.  ABDOMEN: Soft, non-tender, non-distended. Bowel sounds present. No organomegaly or mass.  EXTREMITIES: No pedal edema, cyanosis, or clubbing.  NEUROLOGIC: Cranial nerves II through XII are intact. Muscle strength 5/5 in all extremities. Sensation intact. Gait not checked.  PSYCHIATRIC: The patient is alert and oriented x 3.  SKIN: No obvious rash, lesion, or ulcer.   DATA REVIEW:   CBC  Recent Labs  Lab 09/28/19 0437  WBC 10.0  HGB 12.7  HCT 38.2  PLT 366    Chemistries  Recent Labs  Lab 09/26/19 2157 09/26/19 2157 09/28/19 0437  NA 138   < > 137  K 4.0   < > 3.8  CL 102   < > 107  CO2 27   < > 24  GLUCOSE 121*   < > 95  BUN 16   < > 10  CREATININE 0.92   < > 0.89  CALCIUM 8.9   < > 8.2*  AST 20  --   --   ALT 14  --   --   ALKPHOS 63  --   --   BILITOT 0.7  --   --    < > = values in this interval not displayed.    Microbiology Results   Recent Results (from the past 240  hour(s))  SARS CORONAVIRUS 2 (TAT 6-24 HRS) Nasopharyngeal Nasopharyngeal Swab     Status: None   Collection Time: 09/27/19  7:23 AM   Specimen: Nasopharyngeal Swab  Result Value Ref Range Status   SARS Coronavirus 2 NEGATIVE NEGATIVE Final    Comment: (NOTE) SARS-CoV-2 target nucleic acids are NOT DETECTED. The SARS-CoV-2 RNA is generally detectable in upper and lower respiratory specimens during the acute phase of infection. Negative results do not preclude SARS-CoV-2 infection, do not rule out co-infections with other pathogens, and should not be used as the sole basis for treatment or other patient management decisions. Negative results must be combined with clinical observations, patient history, and epidemiological information. The expected result is Negative. Fact Sheet for Patients: 11/27/19 Fact Sheet for Healthcare Providers: HairSlick.no This test is not yet approved or cleared by the quierodirigir.com FDA and  has been authorized for detection and/or diagnosis of SARS-CoV-2 by FDA under an Emergency Use Authorization (EUA). This EUA  will remain  in effect (meaning this test can be used) for the duration of the COVID-19 declaration under Section 56 4(b)(1) of the Act, 21 U.S.C. section 360bbb-3(b)(1), unless the authorization is terminated or revoked sooner. Performed at [facility], [address], [place], [place] [address]     RADIOLOGY:  DG Abd 1 View  Result Date: 09/28/2019 CLINICAL DATA:  Small-bowel obstruction. EXAM: ABDOMEN - 1 VIEW COMPARISON:  09/27/2019 FINDINGS: The NG tube is stable. The bowel gas pattern is unremarkable. No findings for small bowel obstruction or free air. Scattered stool in the colon. The soft tissue shadows are maintained. Density in the pelvis is likely some type of vaginal birth control ring. IMPRESSION: Stable NG tube. No findings for small bowel obstruction or free air.  Electronically Signed   By: Rudie Meyer M.D.   On: 09/28/2019 08:52   DG Abd 2 Views  Result Date: 09/27/2019 CLINICAL DATA:  Check gastric catheter placement EXAM: ABDOMEN - 2 VIEW COMPARISON:  CT from earlier in the same day FINDINGS: Gastric catheter is noted within the stomach. Proximal side port is seen at the gastroesophageal junction. This could be advanced a few cm further into the stomach. Fecal material is noted throughout the colon. The dilated loops of small bowel are not as well appreciated on these images likely related to their fluid-filled nature. No free air is seen. Contrast from prior CT examination is noted. IMPRESSION: Gastric catheter as described. This could be advanced a few cm further into the stomach. Retained fecal material within the colon. The known small-bowel dilation is not well appreciated due to the fluid-filled nature of the bowel loops. Electronically Signed   By: Alcide Clever M.D.   On: 09/27/2019 16:31     CODE STATUS:     Code Status Orders  (From admission, onward)         Start     Ordered   09/27/19 0732  Full code  Continuous     09/27/19 0734        Code Status History    This patient has a current code status but no historical code status.   Advance Care Planning Activity       TOTAL TIME TAKING CARE OF THIS PATIENT: *40  minutes.    Enedina Finner M.D  [facility]    CC: Primary care physician; System, Pcp Not In

## 2019-09-29 NOTE — Progress Notes (Signed)
Discharge instructions reviewed with the patient. IV removed. Patient insisted on walking out. Patient walked out by my to waiting ride

## 2019-09-29 NOTE — Progress Notes (Signed)
CC: Partial SBO Subjective: She is seen and examined.  Partial SBO.  I have reviewed her CT scan of the abdomen and pelvis as well as the latest KUB latest KUB show evidence of resolve obstruction.  She continues to pass flatus.  No vomiting.  She tolerated full liquid diet today.  She wants to go home today  Objective: Vital signs in last 24 hours: Temp:  [97.8 F (36.6 C)-98.4 F (36.9 C)] 98 F (36.7 C) (04/10 1225) Pulse Rate:  [56-70] 70 (04/10 1225) Resp:  [16] 16 (04/10 0342) BP: (116-135)/(57-78) 135/57 (04/10 1225) SpO2:  [96 %-99 %] 99 % (04/10 1225) Last BM Date: 09/26/19  Intake/Output from previous day: 04/09 0701 - 04/10 0700 In: 2412.7 [I.V.:2412.7] Out: 0  Intake/Output this shift: Total I/O In: 720 [P.O.:720] Out: -   Physical exam: No acute distress.  Awake and alert. Abd: soft, Nt, no peritonitis, + bs. Ext: well perfused and no edema Lab Results: CBC  Recent Labs    09/26/19 2157 09/28/19 0437  WBC 13.4* 10.0  HGB 15.3* 12.7  HCT 45.7 38.2  PLT 456* 366   BMET Recent Labs    09/26/19 2157 09/28/19 0437  NA 138 137  K 4.0 3.8  CL 102 107  CO2 27 24  GLUCOSE 121* 95  BUN 16 10  CREATININE 0.92 0.89  CALCIUM 8.9 8.2*   PT/INR No results for input(s): LABPROT, INR in the last 72 hours. ABG No results for input(s): PHART, HCO3 in the last 72 hours.  Invalid input(s): PCO2, PO2  Studies/Results: DG Abd 1 View  Result Date: 09/28/2019 CLINICAL DATA:  Small-bowel obstruction. EXAM: ABDOMEN - 1 VIEW COMPARISON:  09/27/2019 FINDINGS: The NG tube is stable. The bowel gas pattern is unremarkable. No findings for small bowel obstruction or free air. Scattered stool in the colon. The soft tissue shadows are maintained. Density in the pelvis is likely some type of vaginal birth control ring. IMPRESSION: Stable NG tube. No findings for small bowel obstruction or free air. Electronically Signed   By: Rudie Meyer M.D.   On: 09/28/2019 08:52   DG  Abd 2 Views  Result Date: 09/27/2019 CLINICAL DATA:  Check gastric catheter placement EXAM: ABDOMEN - 2 VIEW COMPARISON:  CT from earlier in the same day FINDINGS: Gastric catheter is noted within the stomach. Proximal side port is seen at the gastroesophageal junction. This could be advanced a few cm further into the stomach. Fecal material is noted throughout the colon. The dilated loops of small bowel are not as well appreciated on these images likely related to their fluid-filled nature. No free air is seen. Contrast from prior CT examination is noted. IMPRESSION: Gastric catheter as described. This could be advanced a few cm further into the stomach. Retained fecal material within the colon. The known small-bowel dilation is not well appreciated due to the fluid-filled nature of the bowel loops. Electronically Signed   By: Alcide Clever M.D.   On: 09/27/2019 16:31    Anti-infectives: Anti-infectives (From admission, onward)   None      Assessment/Plan:  Resolve ileus/partial small bowel obstruction.  Advance diet as tolerated.  No need for surgical intervention at this time.  Sterling Big, MD, [facility]  09/29/2019

## 2019-10-10 ENCOUNTER — Telehealth (INDEPENDENT_AMBULATORY_CARE_PROVIDER_SITE_OTHER): Payer: PRIVATE HEALTH INSURANCE | Admitting: Gastroenterology

## 2019-10-10 ENCOUNTER — Encounter: Payer: Self-pay | Admitting: Gastroenterology

## 2019-10-10 DIAGNOSIS — Z5329 Procedure and treatment not carried out because of patient's decision for other reasons: Secondary | ICD-10-CM

## 2019-10-16 NOTE — Progress Notes (Signed)
No show

## 2019-11-27 ENCOUNTER — Ambulatory Visit (INDEPENDENT_AMBULATORY_CARE_PROVIDER_SITE_OTHER): Payer: Managed Care, Other (non HMO) | Admitting: Gastroenterology

## 2019-11-27 ENCOUNTER — Other Ambulatory Visit: Payer: Self-pay

## 2019-11-27 VITALS — BP 112/73 | HR 64 | Temp 97.9°F | Ht 64.0 in | Wt 152.0 lb

## 2019-11-27 DIAGNOSIS — R933 Abnormal findings on diagnostic imaging of other parts of digestive tract: Secondary | ICD-10-CM | POA: Diagnosis not present

## 2019-11-27 DIAGNOSIS — R194 Change in bowel habit: Secondary | ICD-10-CM | POA: Diagnosis not present

## 2019-11-27 NOTE — Progress Notes (Signed)
Ann Bellows MD, MRCP(U.K) [address]  [place]  [place], [place] [address]  Main: [phone]  Fax: [phone]   Primary Care Physician: System, Pcp Not In  Primary Gastroenterologist:  Dr. Harland German follow-up  HPI: Ann Gibson is a 34 y.o. female   She is here today to see me as a hospital follow-up.  She was admitted to the hospital on 09/27/2019 and I was consulted for irritable bowel syndrome.  She presented the emergency room with nausea vomiting and was found to have a partial small bowel obstruction with transition in the mid abdomen.  She also underwent a right upper quadrant ultrasound which was normal.  Labs including hemoglobin and LFTs are normal.  Dr. Hampton Abbot saw the patient from surgery and was concerned about thickening of the distal small bowel.  Has been using Excedrin on a regular basis for headaches.  She has had issues with her bowels ranging from constipation to diarrhea.  She was treated conservatively with NG tube suctioning which resolved her symptoms and she was discharged home.  CRP was also normal.  Interval history 09/27/2019-11/27/2019  Since discharge has had no vomiting but has had crampy lower abdominal pain.  Does not have bowel movements daily.  Up to 2 times a week.  At times is very hard.  Not tried any laxatives.  Denies any regular NSAID use.  Not had a colonoscopy before.  Current Outpatient Medications  Medication Sig Dispense Refill  . Aspirin-Acetaminophen-Caffeine (EXCEDRIN PO) Take 2 tablets by mouth every 6 (six) hours as needed.    . Creatine Monohydrate POWD Take 0.5 g by mouth daily.    Marland Kitchen GLUTAMINE PO Take 1 each by mouth every morning.    Marland Kitchen ibuprofen (ADVIL) 200 MG tablet Take 200-400 mg by mouth every 6 (six) hours as needed for headache.    . Multiple Vitamin (MULTIVITAMIN) capsule Take 1 capsule by mouth daily.    . Probiotic Product (PROBIOTIC-10 PO) Take 1 capsule by mouth daily.    . S-Adenosylmethionine  (SAM-E) 200 MG TABS Take 400 mg by mouth daily.     No current facility-administered medications for this visit.    Allergies as of 11/27/2019 - Review Complete 10/10/2019  Allergen Reaction Noted  . Sulfa antibiotics Swelling 09/26/2019    ROS:  General: Negative for anorexia, weight loss, fever, chills, fatigue, weakness. ENT: Negative for hoarseness, difficulty swallowing , nasal congestion. CV: Negative for chest pain, angina, palpitations, dyspnea on exertion, peripheral edema.  Respiratory: Negative for dyspnea at rest, dyspnea on exertion, cough, sputum, wheezing.  GI: See history of present illness. GU:  Negative for dysuria, hematuria, urinary incontinence, urinary frequency, nocturnal urination.  Endo: Negative for unusual weight change.    Physical Examination:   There were no vitals taken for this visit.  General: Well-nourished, well-developed in no acute distress.  Eyes: No icterus. Conjunctivae pink. Mouth: Oropharyngeal mucosa moist and pink , no lesions erythema or exudate. Lungs: Clear to auscultation bilaterally. Non-labored. Heart: Regular rate and rhythm, no murmurs rubs or gallops.  Abdomen: Bowel sounds are normal, nontender, nondistended, no hepatosplenomegaly or masses, no abdominal bruits or hernia , no rebound or guarding.   Neuro: Alert and oriented x 3.  Grossly intact. Skin: Warm and dry, no jaundice.   Psych: Alert and cooperative, normal mood and affect.   Imaging Studies: No results found.  Assessment and Plan:   Ann Gibson is a 34 y.o. y/o female here today to  see me as a hospital follow-up.  Admitted in April 2021 with partial small bowel obstruction.  Transition point in the mid abdomen.  Treated conservatively with NG tube and suctioning and the bowel obstruction resolved.  She was sent home subsequently.  Initial CT scan showed no enteritis or obstruction clearly.  Plan was for outpatient evaluation to rule out any strictures of the  small bowel.  Plan 1.  Colonoscopy to evaluate change in bowel habits with constipation 2.  MR enterography of the small bowel to rule out any strictures. 3.  Trial of commencing on Linzess 145 mcg: 2-week sample provided  I have discussed alternative options, risks & benefits,  which include, but are not limited to, bleeding, infection, perforation,respiratory complication & drug reaction.  The patient agrees with this plan & written consent will be obtained.    Dr Wyline Mood  MD,MRCP [facility]) Follow up in 6 weeks

## 2019-11-28 ENCOUNTER — Other Ambulatory Visit: Payer: Self-pay

## 2019-11-28 DIAGNOSIS — R933 Abnormal findings on diagnostic imaging of other parts of digestive tract: Secondary | ICD-10-CM

## 2019-12-04 MED ORDER — NA SULFATE-K SULFATE-MG SULF 17.5-3.13-1.6 GM/177ML PO SOLN: 1.0000 | Freq: Once | ORAL | 0 refills | Status: AC

## 2019-12-04 NOTE — Addendum Note (Signed)
Addended by: Daisy Blossom on: 12/04/2019 10:48 AM   Modules accepted: Orders

## 2019-12-05 ENCOUNTER — Other Ambulatory Visit: Payer: Self-pay

## 2019-12-05 ENCOUNTER — Other Ambulatory Visit
Admission: RE | Admit: 2019-12-05 | Discharge: 2019-12-05 | Disposition: A | Discharge status: Home / Self Care | Payer: Managed Care, Other (non HMO) | Source: Ambulatory Visit | Attending: Gastroenterology | Admitting: Gastroenterology

## 2019-12-05 DIAGNOSIS — Z01812 Encounter for preprocedural laboratory examination: Secondary | ICD-10-CM | POA: Insufficient documentation

## 2019-12-05 DIAGNOSIS — Z20822 Contact with and (suspected) exposure to covid-19: Secondary | ICD-10-CM | POA: Diagnosis not present

## 2019-12-05 LAB — SARS CORONAVIRUS 2 (TAT 6-24 HRS): SARS Coronavirus 2: NEGATIVE

## 2019-12-07 ENCOUNTER — Other Ambulatory Visit: Payer: Self-pay

## 2019-12-07 ENCOUNTER — Ambulatory Visit
Admission: RE | Admit: 2019-12-07 | Discharge: 2019-12-07 | Disposition: A | Discharge status: Home / Self Care | Payer: Managed Care, Other (non HMO) | Attending: Gastroenterology | Admitting: Gastroenterology

## 2019-12-07 ENCOUNTER — Encounter
Admission: RE | Disposition: A | Discharge status: Home / Self Care | Payer: Self-pay | Source: Home / Self Care | Attending: Gastroenterology

## 2019-12-07 ENCOUNTER — Encounter: Payer: Self-pay | Admitting: Anesthesiology

## 2019-12-07 DIAGNOSIS — K5900 Constipation, unspecified: Secondary | ICD-10-CM | POA: Diagnosis not present

## 2019-12-07 DIAGNOSIS — Z791 Long term (current) use of non-steroidal anti-inflammatories (NSAID): Secondary | ICD-10-CM | POA: Insufficient documentation

## 2019-12-07 DIAGNOSIS — K5909 Other constipation: Secondary | ICD-10-CM

## 2019-12-07 DIAGNOSIS — Z87891 Personal history of nicotine dependence: Secondary | ICD-10-CM | POA: Diagnosis not present

## 2019-12-07 DIAGNOSIS — K633 Ulcer of intestine: Secondary | ICD-10-CM | POA: Diagnosis not present

## 2019-12-07 DIAGNOSIS — Z7982 Long term (current) use of aspirin: Secondary | ICD-10-CM | POA: Diagnosis not present

## 2019-12-07 DIAGNOSIS — Z882 Allergy status to sulfonamides status: Secondary | ICD-10-CM | POA: Insufficient documentation

## 2019-12-07 DIAGNOSIS — R933 Abnormal findings on diagnostic imaging of other parts of digestive tract: Secondary | ICD-10-CM

## 2019-12-07 HISTORY — PX: COLONOSCOPY WITH PROPOFOL: SHX5780

## 2019-12-07 LAB — POCT PREGNANCY, URINE: Preg Test, Ur: NEGATIVE

## 2019-12-07 SURGERY — COLONOSCOPY WITH PROPOFOL
Anesthesia: General

## 2019-12-07 MED ORDER — PROPOFOL 10 MG/ML IV BOLUS
INTRAVENOUS | Status: AC
  Filled 2019-12-07: qty 120

## 2019-12-07 MED ORDER — LIDOCAINE HCL (PF) 2 % IJ SOLN
INTRAMUSCULAR | Status: AC
  Filled 2019-12-07: qty 15

## 2019-12-07 MED ORDER — SODIUM CHLORIDE 0.9 % IV SOLN: INTRAVENOUS | Status: DC

## 2019-12-07 NOTE — Op Note (Signed)
[facility] Gastroenterology Patient Name: Angeliki Mates Procedure Date: 12/07/2019 8:40 AM MRN: [id number] Account #: [id number] Date of Birth: [birth year] Admit Type: Outpatient Age: 34 Room: [facility] ENDO ROOM 3 Gender: Female Note Status: Finalized Procedure:             Colonoscopy Indications:           Constipation Providers:             Wyline Mood MD, MD Medicines:             None Complications:         No immediate complications. Procedure:             Pre-Anesthesia Assessment:                        - Prior to the procedure, a History and Physical was                         performed, and patient medications, allergies and                         sensitivities were reviewed. The patient's tolerance                         of previous anesthesia was reviewed.                        - The risks and benefits of the procedure and the                         sedation options and risks were discussed with the                         patient. All questions were answered and informed                         consent was obtained.                        - ASA Grade Assessment: I - A normal, healthy patient.                        After obtaining informed consent, the colonoscope was                         passed under direct vision. Throughout the procedure,                         the patient's blood pressure, pulse, and oxygen                         saturations were monitored continuously. The                         Colonoscope was introduced through the anus and                         advanced to the the cecum, identified by the  appendiceal orifice. The colonoscopy was performed                         with ease. The patient tolerated the procedure well.                         The quality of the bowel preparation was excellent. Findings:      The terminal ileum contained a few three mm ulcers. No bleeding was       present. No  stigmata of recent bleeding were seen. Biopsies were taken       with a cold forceps for histology.      The exam was otherwise without abnormality on direct and retroflexion       views. Impression:            - A few ulcers in the terminal ileum. Biopsied.                        - The examination was otherwise normal on direct and                         retroflexion views. Recommendation:        - Discharge patient to home (with escort).                        - Resume previous diet.                        - Continue present medications.                        - Await pathology results.                        - - Repeat colonoscopy [day] for screening purposes.                         atage 60                        - Return to my office as previously scheduled. Procedure Code(s):     --- Professional ---                        [phone], Colonoscopy, flexible; with biopsy, single or                         multiple Diagnosis Code(s):     --- Professional ---                        K63.3, Ulcer of intestine                        K59.00, Constipation, unspecified CPT copyright 2019 American Medical Association. All rights reserved. The codes documented in this report are preliminary and upon coder review may  be revised to meet current compliance requirements. [name], MD [name] MD, MD 12/07/2019 9:14:29 AM This report has been signed electronically. Number of Addenda: 0 Note Initiated On: 12/07/2019 8:40 AM Scope Withdrawal Time: 0 hours 13 minutes 32 seconds  Total Procedure Duration: 0 hours  17 minutes 42 seconds  Estimated Blood Loss:  Estimated blood loss: none.      [facility]

## 2019-12-07 NOTE — H&P (Signed)
[name], MD [address], [place], [place], [place], [address] [address], [place], [place], [place], [address] Phone: [phone]  Fax: [phone]  Primary Care Physician:  System, Pcp Not In   Pre-Procedure History & Physical: HPI:  Ann Gibson is a 34 y.o. female is here for an colonoscopy.   No past medical history on file.  No past surgical history on file.  Prior to Admission medications   Medication Sig Start Date End Date Taking? Authorizing Provider  Aspirin-Acetaminophen-Caffeine (EXCEDRIN PO) Take 2 tablets by mouth every 6 (six) hours as needed.   Yes [provider]  Creatine Monohydrate POWD Take 0.5 g by mouth daily.   Yes [provider]  GLUTAMINE PO Take 1 each by mouth every morning.   Yes [provider]  ibuprofen (ADVIL) 200 MG tablet Take 200-400 mg by mouth every 6 (six) hours as needed for headache.   Yes [provider]  Multiple Vitamin (MULTIVITAMIN) capsule Take 1 capsule by mouth daily.   Yes [provider]  S-Adenosylmethionine (SAM-E) 200 MG TABS Take 400 mg by mouth daily.   Yes [provider]  Probiotic Product (PROBIOTIC-10 PO) Take 1 capsule by mouth daily.    [provider]    Allergies as of 12/04/2019 - Review Complete 11/27/2019  Allergen Reaction Noted  . Sulfa antibiotics Swelling 09/26/2019    No family history on file.  Social History   Socioeconomic History  . Marital status: Single    Spouse name: Not on file  . Number of children: Not on file  . Years of education: Not on file  . Highest education level: Bachelor's degree (e.g., BA, AB, BS)  Occupational History  . Not on file  Tobacco Use  . Smoking status: Former Research scientist (life sciences)  . Smokeless tobacco: Never Used  Substance and Sexual Activity  . Alcohol use: Not Currently    Comment: socially  . Drug use: Never  . Sexual activity: Not on file  Other Topics Concern  . Not on file  Social  History Narrative  . Not on file   Social Determinants of Health   Financial Resource Strain:   . Difficulty of Paying Living Expenses:   Food Insecurity:   . Worried About Charity fundraiser in the Last Year:   . Arboriculturist in the Last Year:   Transportation Needs:   . Film/video editor (Medical):   Marland Kitchen Lack of Transportation (Non-Medical):   Physical Activity:   . Days of Exercise per Week:   . Minutes of Exercise per Session:   Stress:   . Feeling of Stress :   Social Connections:   . Frequency of Communication with Friends and Family:   . Frequency of Social Gatherings with Friends and Family:   . Attends Religious Services:   . Active Member of Clubs or Organizations:   . Attends Archivist Meetings:   Marland Kitchen Marital Status:   Intimate Partner Violence:   . Fear of Current or Ex-Partner:   . Emotionally Abused:   Marland Kitchen Physically Abused:   . Sexually Abused:     Review of Systems: See HPI, otherwise negative ROS  Physical Exam: BP 123/79   Pulse 65   Temp 97.7 F (36.5 C) (Temporal)   Resp 16   Ht 5\' 4"  (1.626 m)   Wt 68 kg   SpO2 100%   BMI 25.75 kg/m  General:   Alert,  pleasant and cooperative in NAD Head:  Normocephalic and atraumatic. Neck:  Supple; no masses or thyromegaly. Lungs:  Clear throughout to auscultation, normal respiratory effort.    Heart:  +S1, +S2, Regular rate and rhythm, No edema. Abdomen:  Soft, nontender and nondistended. Normal bowel sounds, without guarding, and without rebound.   Neurologic:  Alert and  oriented x4;  grossly normal neurologically.  Impression/Plan: Ann Gibson is here for an colonoscopy to be performed for constipation.  Risks, benefits, limitations, and alternatives regarding  colonoscopy have been reviewed with the patient.  Questions have been answered.  All parties agreeable.   Wyline Mood, MD  12/07/2019, 8:45 AM

## 2019-12-10 ENCOUNTER — Encounter: Payer: Self-pay | Admitting: Gastroenterology

## 2019-12-10 LAB — SURGICAL PATHOLOGY

## 2019-12-11 ENCOUNTER — Ambulatory Visit
Admission: RE | Admit: 2019-12-11 | Discharge: 2019-12-11 | Disposition: A | Discharge status: Home / Self Care | Payer: Managed Care, Other (non HMO) | Source: Ambulatory Visit | Attending: Gastroenterology | Admitting: Gastroenterology

## 2019-12-11 ENCOUNTER — Other Ambulatory Visit: Payer: Self-pay

## 2019-12-11 DIAGNOSIS — R933 Abnormal findings on diagnostic imaging of other parts of digestive tract: Secondary | ICD-10-CM | POA: Diagnosis not present

## 2019-12-11 MED ORDER — GADOBUTROL 1 MMOL/ML IV SOLN
6.0000 mL | Freq: Once | INTRAVENOUS | Status: AC | PRN
  Administered 2019-12-11: 6 mL via INTRAVENOUS

## 2019-12-17 ENCOUNTER — Encounter: Payer: Self-pay | Admitting: Gastroenterology

## 2019-12-18 ENCOUNTER — Telehealth: Payer: Self-pay

## 2019-12-18 NOTE — Telephone Encounter (Signed)
-----   Message from Wyline Mood, MD sent at 12/17/2019  9:23 AM EDT ----- No significant inflammation seen - mostly stool - enquire if she suffers from constipation if yes - high fiber diet +/- 1 cap of miralax a day and watch for results

## 2019-12-18 NOTE — Telephone Encounter (Signed)
-----   Message from Wyline Mood, MD sent at 12/17/2019  9:26 AM EDT ----- Biopsies were benign

## 2019-12-18 NOTE — Telephone Encounter (Signed)
Called and left a message for call back to go over biopsy results and MR Entero of pelvis and abdomen results

## 2020-01-08 ENCOUNTER — Other Ambulatory Visit: Payer: Self-pay

## 2020-01-08 ENCOUNTER — Ambulatory Visit (INDEPENDENT_AMBULATORY_CARE_PROVIDER_SITE_OTHER): Payer: Managed Care, Other (non HMO) | Admitting: Gastroenterology

## 2020-01-08 VITALS — BP 113/77 | HR 64 | Temp 98.5°F | Ht 64.0 in | Wt 154.0 lb

## 2020-01-08 DIAGNOSIS — R933 Abnormal findings on diagnostic imaging of other parts of digestive tract: Secondary | ICD-10-CM

## 2020-01-08 NOTE — Progress Notes (Signed)
Wyline Mood MD, MRCP(U.K) [address]  [address]  [place], [place] [address]  Main: [phone]  Fax: [phone]   Primary Care Physician: System, Pcp Not In  Primary Gastroenterologist:  Dr. Wyline Mood     HPI: Ann Gibson is a 34 y.o. female   Summary of history :  She was admitted to the hospital on 09/27/2019 with nausea vomiting and was found to have a partial small bowel obstruction with transition in the mid abdomen.  She also underwent a right upper quadrant ultrasound which was normal.  Labs including hemoglobin and LFTs are normal.  Dr. Aleen Campi saw the patient from surgery and was concerned about thickening of the distal small bowel.  Has been using Excedrin on a regular basis for headaches.  She has had issues with her bowels ranging from constipation to diarrhea.  She was treated conservatively with NG tube suctioning which resolved her symptoms and she was discharged home.  CRP was also normal. Does not have bowel movements daily.  Up to 2 times a week.  At times is very hard   I was consulted for irritable bowel syndrome.   Interval history 11/27/2019-01/08/2020  12/12/2019: MR enterography: Limitation of study due to motion artifact.  Within the limitation of study no evidence of small bowel obstruction no gross small bowel wall thickening or enhancement.  Large colonic stool volume noted.  Features of constipation suggested.  12/07/2019: Colonoscopy: Terminal ileum contained a few aphthous ulcers.  No bleeding was noted.  Biopsies were taken.  Otherwise the colonoscopy appeared normal.  Biopsies of the terminal ileum demonstrated ileal mucosa with pale patches.  Negative for intraepithelial lymphocytes or active inflammation.   Since her colonoscopy she has been having regular bowel movements to the extent that she has not required any Linzess at all.  No other complaints.  Doing well otherwise.  She does not take any NSAIDs presently.  But she recollects she  was taking a health supplement containing adenopathy in which she says sometimes can act like an NSAID. Current Outpatient Medications  Medication Sig Dispense Refill  . Aspirin-Acetaminophen-Caffeine (EXCEDRIN PO) Take 2 tablets by mouth every 6 (six) hours as needed.    . Creatine Monohydrate POWD Take 0.5 g by mouth daily.    Marland Kitchen GLUTAMINE PO Take 1 each by mouth every morning.    Marland Kitchen ibuprofen (ADVIL) 200 MG tablet Take 200-400 mg by mouth every 6 (six) hours as needed for headache.    . Multiple Vitamin (MULTIVITAMIN) capsule Take 1 capsule by mouth daily.    . Probiotic Product (PROBIOTIC-10 PO) Take 1 capsule by mouth daily.    . S-Adenosylmethionine (SAM-E) 200 MG TABS Take 400 mg by mouth daily.     No current facility-administered medications for this visit.    Allergies as of 01/08/2020 - Review Complete 12/07/2019  Allergen Reaction Noted  . Sulfa antibiotics Swelling 09/26/2019    ROS:  General: Negative for anorexia, weight loss, fever, chills, fatigue, weakness. ENT: Negative for hoarseness, difficulty swallowing , nasal congestion. CV: Negative for chest pain, angina, palpitations, dyspnea on exertion, peripheral edema.  Respiratory: Negative for dyspnea at rest, dyspnea on exertion, cough, sputum, wheezing.  GI: See history of present illness. GU:  Negative for dysuria, hematuria, urinary incontinence, urinary frequency, nocturnal urination.  Endo: Negative for unusual weight change.    Physical Examination:   There were no vitals taken for this visit.  General: Well-nourished, well-developed in no acute distress.  Eyes: No  icterus. Conjunctivae pink. Neuro: Alert and oriented x 3.  Grossly intact. Skin: Warm and dry, no jaundice.   Psych: Alert and cooperative, normal mood and affect.   Imaging Studies: MR ENTERO ABDOMEN W WO CONTRAST  Result Date: 12/12/2019 CLINICAL DATA:  History of small-bowel obstruction. EXAM: MR ABDOMEN AND PELVIS WITHOUT AND WITH  CONTRAST (MR ENTEROGRAPHY) TECHNIQUE: Multiplanar, multisequence MRI of the abdomen and pelvis was performed both before and during bolus administration of intravenous contrast. Negative oral contrast VoLumen was given. CONTRAST:  75mL GADAVIST GADOBUTROL 1 MMOL/ML IV SOLN COMPARISON:  CT scan 09/27/2019 FINDINGS: COMBINED FINDINGS FOR BOTH MR ABDOMEN AND PELVIS Lower chest: Unremarkable. Hepatobiliary: Assessment of liver is limited by artifact from field of view limitations. Within this limitation no gross parenchymal abnormality evident. There is no evidence for gallstones, gallbladder wall thickening, or pericholecystic fluid. No intrahepatic or extrahepatic biliary dilation. Pancreas: No focal mass lesion. No dilatation of the main duct. No intraparenchymal cyst. No peripancreatic edema. Spleen:  No splenomegaly. No focal mass lesion. Adrenals/Urinary Tract: No adrenal nodule or mass. Kidneys unremarkable. No evidence for hydroureter. The urinary bladder appears normal for the degree of distention. Stomach/Bowel: Stomach is well distended with contrast. No wall thickening or abnormal mural enhancement. Evaluation of small-bowel markedly limited by substantial motion degradation. Within this limitation there is no evidence for small bowel obstruction. No gross abnormal small bowel enhancement although assessment is markedly limited by the substantial motion degradation. Terminal ileum not well seen on postcontrast imaging. Large colonic stool volume noted. Vascular/Lymphatic: No abdominal aortic aneurysm. No abdominal aortic atherosclerotic calcification. There is no gastrohepatic or hepatoduodenal ligament lymphadenopathy. No retroperitoneal or mesenteric lymphadenopathy. No pelvic sidewall lymphadenopathy. Reproductive: The uterus is unremarkable.  There is no adnexal mass. Other:  No intraperitoneal free fluid. Musculoskeletal: No suspicious marrow enhancement within the visualized bony anatomy. IMPRESSION: 1.  Marked study limitation due to motion artifact, likely from prominent small bowel motility. Within this limitation there is no evidence for small bowel obstruction. No gross abnormal small bowel wall thickening or enhancement is evident although sensitivity is limited due to artifact. 2. Large colonic stool volume noted. Imaging features compatible with constipation in the appropriate clinical setting. Electronically Signed   By: Kennith Center M.D.   On: 12/12/2019 13:00   MR ENTERO PELVIS W WO CONTRAST  Result Date: 12/12/2019 CLINICAL DATA:  History of small-bowel obstruction. EXAM: MR ABDOMEN AND PELVIS WITHOUT AND WITH CONTRAST (MR ENTEROGRAPHY) TECHNIQUE: Multiplanar, multisequence MRI of the abdomen and pelvis was performed both before and during bolus administration of intravenous contrast. Negative oral contrast VoLumen was given. CONTRAST:  58mL GADAVIST GADOBUTROL 1 MMOL/ML IV SOLN COMPARISON:  CT scan 09/27/2019 FINDINGS: COMBINED FINDINGS FOR BOTH MR ABDOMEN AND PELVIS Lower chest: Unremarkable. Hepatobiliary: Assessment of liver is limited by artifact from field of view limitations. Within this limitation no gross parenchymal abnormality evident. There is no evidence for gallstones, gallbladder wall thickening, or pericholecystic fluid. No intrahepatic or extrahepatic biliary dilation. Pancreas: No focal mass lesion. No dilatation of the main duct. No intraparenchymal cyst. No peripancreatic edema. Spleen:  No splenomegaly. No focal mass lesion. Adrenals/Urinary Tract: No adrenal nodule or mass. Kidneys unremarkable. No evidence for hydroureter. The urinary bladder appears normal for the degree of distention. Stomach/Bowel: Stomach is well distended with contrast. No wall thickening or abnormal mural enhancement. Evaluation of small-bowel markedly limited by substantial motion degradation. Within this limitation there is no evidence for small bowel obstruction. No gross abnormal small bowel  enhancement although assessment is markedly limited by the substantial motion degradation. Terminal ileum not well seen on postcontrast imaging. Large colonic stool volume noted. Vascular/Lymphatic: No abdominal aortic aneurysm. No abdominal aortic atherosclerotic calcification. There is no gastrohepatic or hepatoduodenal ligament lymphadenopathy. No retroperitoneal or mesenteric lymphadenopathy. No pelvic sidewall lymphadenopathy. Reproductive: The uterus is unremarkable.  There is no adnexal mass. Other:  No intraperitoneal free fluid. Musculoskeletal: No suspicious marrow enhancement within the visualized bony anatomy. IMPRESSION: 1. Marked study limitation due to motion artifact, likely from prominent small bowel motility. Within this limitation there is no evidence for small bowel obstruction. No gross abnormal small bowel wall thickening or enhancement is evident although sensitivity is limited due to artifact. 2. Large colonic stool volume noted. Imaging features compatible with constipation in the appropriate clinical setting. Electronically Signed   By: Kennith Center M.D.   On: 12/12/2019 13:00    Assessment and Plan:   Ann Gibson is a 34 y.o. y/o female  Who was admitted in April 2021 with partial small bowel obstruction.  Transition point in the mid abdomen.  Treated conservatively with NG tube and suctioning and the bowel obstruction resolved.  She was sent home subsequently.  Initial CT scan showed no enteritis or obstruction clearly.  MR enterography showed no abnormality.  Colonoscopy with biopsies of the terminal ileum showed no abnormalities.  Previously had constipation which has resolved on its own.  I have informed her not to take any NSAIDs in the future.    Dr Wyline Mood  MD,MRCP [facility]) Follow up in as needed

## 2020-10-03 IMAGING — MR MR [PERSON_NAME] PELVIS W/CM
24 series · 48 of 48 positions shown · IV contrast (gadavist)
Comparison: CT scan 09/27/2019

CLINICAL DATA: History of small-bowel obstruction.

EXAM:
MR ABDOMEN AND PELVIS WITHOUT AND WITH CONTRAST (MR ENTEROGRAPHY)
TECHNIQUE: Multiplanar, multisequence MRI of the abdomen and pelvis was
performed both before and during bolus administration of intravenous
contrast. Negative oral contrast VoLumen was given.
CONTRAST:  6mL GADAVIST GADOBUTROL 1 MMOL/ML IV SOLN

[Series 8: t2_haste_tra_p2_mbh · axial · 6.0mm · 1.25mm/px · 1 of 44 slices shown (1 of 2)]
[im 1/44]
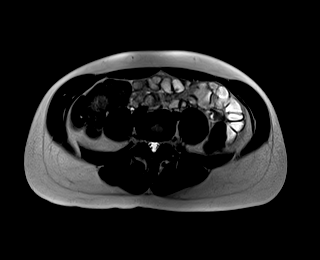

[Series 9: t2_haste_tra_p2_mbh · axial · 6.0mm · 1.25mm/px · 1 of 44 slices shown (2 of 2)]
[im 1/44]
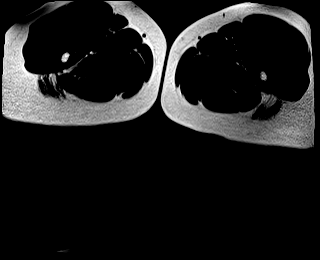

[Series 10: t2_haste_tra_p2_mbh_comp · axial · 6.0mm · 1.25mm/px · 1 of 88 slices shown]
[im 1/88]
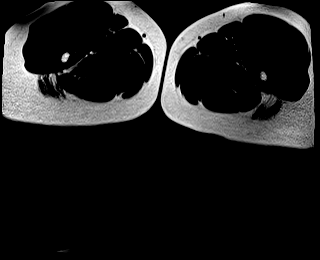

[Series 11: cor haste mbh · coronal · 5.0mm · 0.98mm/px · 1 of 41 slices shown]
[im 1/41]
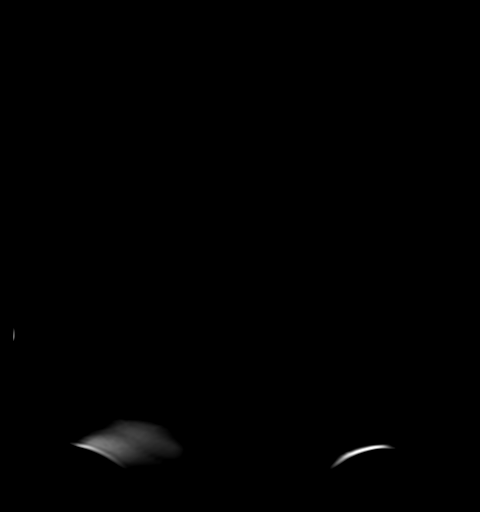

[Series 12: ax dwi_tracew · axial · 6.0mm · 1.49mm/px · z∈[-129,+129]mm · 2 of 132 slices shown (1 of 2)]
[im 1/132]
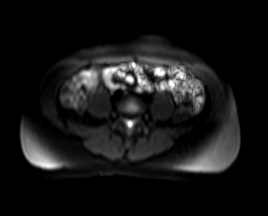
[im 132/132]
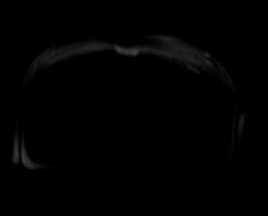

[Series 13: ax dwi_adc · axial · 6.0mm · 1.49mm/px · 1 of 44 slices shown (1 of 2)]
[im 1/44]
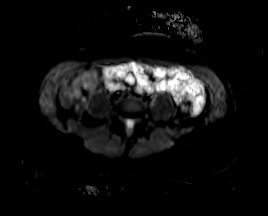

[Series 14: ax dwi_tracew · axial · 6.0mm · 1.49mm/px · z∈[-393,-135]mm · 3 of 132 slices shown (2 of 2)]
[im 1/132]
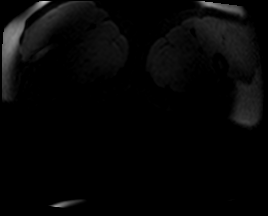
[im 66/132]
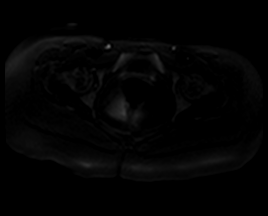
[im 132/132]
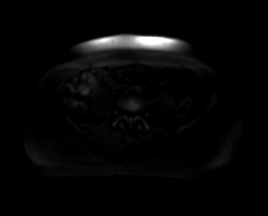

[Series 15: ax dwi_adc · axial · 6.0mm · 1.49mm/px · 1 of 44 slices shown (2 of 2)]
[im 1/44]
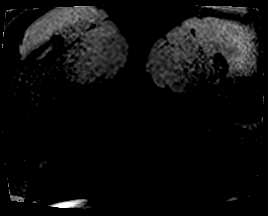

[Series 16: ax dwi_adc_comp · axial · 6.0mm · 1.49mm/px · z∈[-393,+129]mm · 2 of 88 slices shown]
[im 1/88]
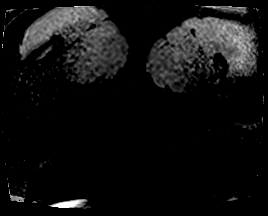
[im 88/88]
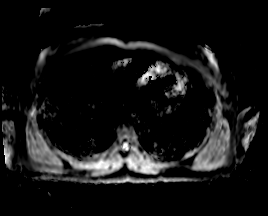

[Series 17: ax dwi_tracew_comp · axial · 6.0mm · 1.49mm/px · z∈[-393,+129]mm · 5 of 264 slices shown]
[im 1/264]
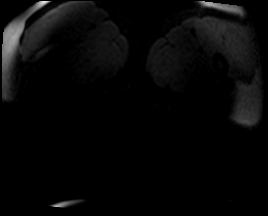
[im 66/264]
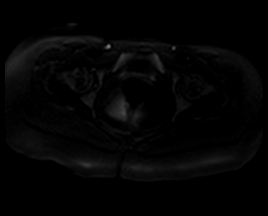
[im 132/264]
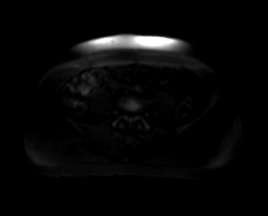
[im 198/264]
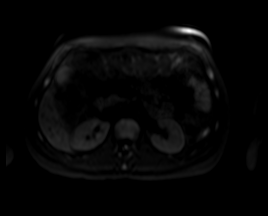
[im 264/264]
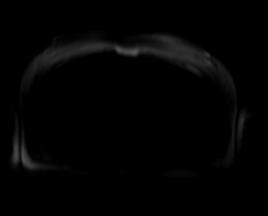

[Series 18: cor true fisp · coronal · 5.0mm · 0.78mm/px · 1 of 40 slices shown]
[im 1/40]
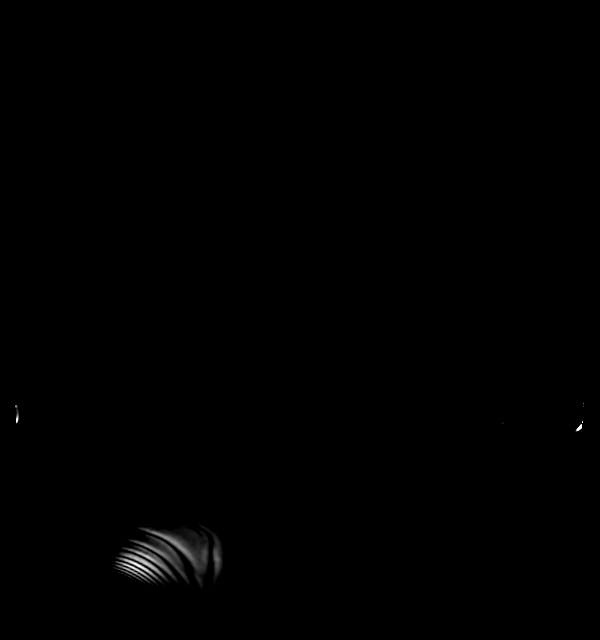

[Series 19: T1 dynamic · axial · 3.9mm · 1.64mm/px · z∈[-342,+154]mm · 3 of 128 slices shown (1 of 13)]
[im 1/128]
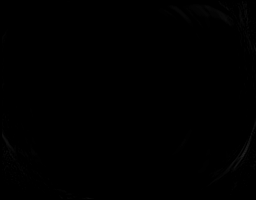
[im 64/128]
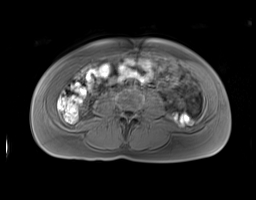
[im 128/128]
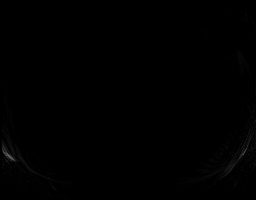

[Series 20: T1 dynamic · axial · 4.1mm · 1.64mm/px · z∈[-339,+116]mm · 2 of 112 slices shown (2 of 13)]
[im 1/112]
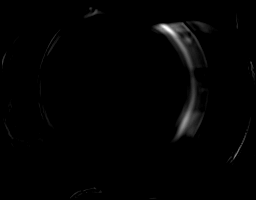
[im 112/112]
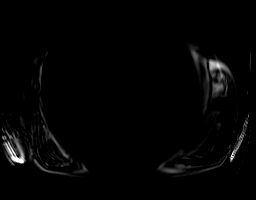

[Series 21: T1 dynamic · axial · 4.1mm · 1.64mm/px · z∈[-339,+116]mm · 2 of 112 slices shown (3 of 13)]
[im 1/112]
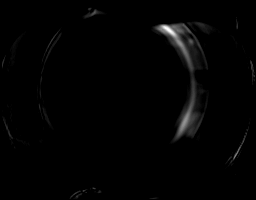
[im 112/112]
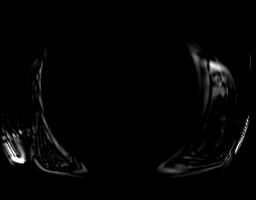

[Series 22: T1 dynamic · axial · 4.1mm · 1.64mm/px · z∈[-339,+116]mm · 2 of 112 slices shown (4 of 13)]
[im 1/112]
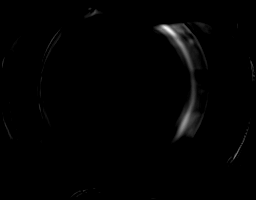
[im 112/112]
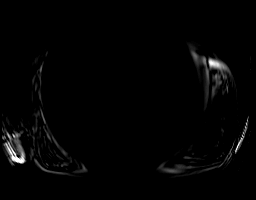

[Series 23: T1 dynamic · axial · 4.1mm · 1.64mm/px · z∈[-339,+116]mm · 2 of 112 slices shown (5 of 13)]
[im 1/112]
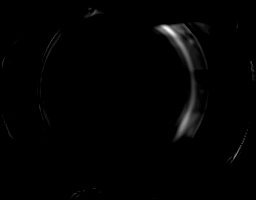
[im 112/112]
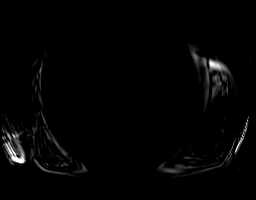

[Series 24: T1 dynamic · coronal · 1.6mm · 1.95mm/px · 3 of 128 slices shown (6 of 13)]
[im 1/128]
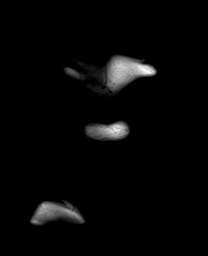
[im 64/128]
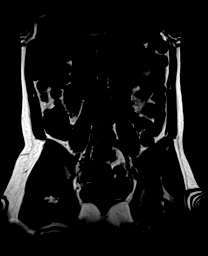
[im 128/128]
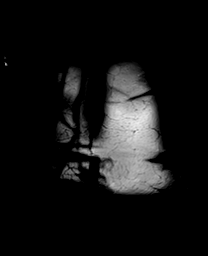

[Series 25: T1 dynamic · coronal · 1.6mm · 1.95mm/px · 3 of 128 slices shown (7 of 13)]
[im 1/128]
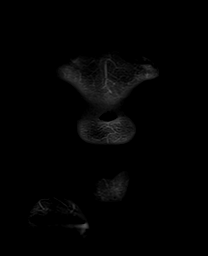
[im 64/128]
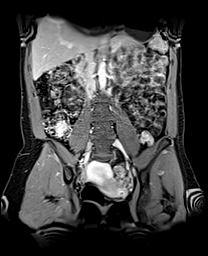
[im 128/128]
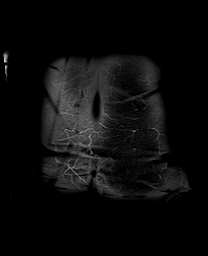

[Series 26: T1 dynamic · axial · 4.1mm · 1.64mm/px · z∈[-339,+116]mm · 2 of 112 slices shown (8 of 13)]
[im 1/112]
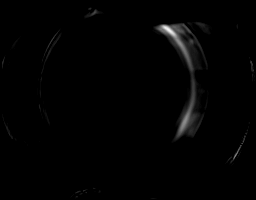
[im 112/112]
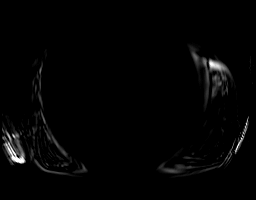

[Series 1029: T1 dynamic · axial · 4.1mm · 1.64mm/px · z∈[-294,+71]mm · 2 of 90 slices shown (9 of 13)]
[im 1/90]
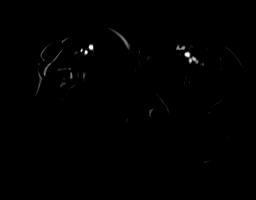
[im 90/90]
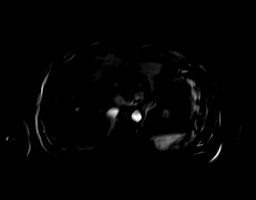

[Series 1033: T1 dynamic · axial · 4.1mm · 1.64mm/px · z∈[-294,+71]mm · 2 of 90 slices shown (10 of 13)]
[im 1/90]
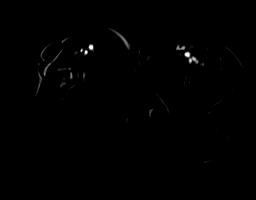
[im 90/90]
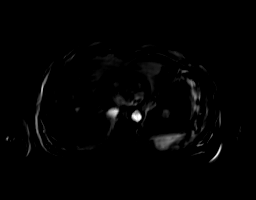

[Series 1037: T1 dynamic · axial · 4.1mm · 1.64mm/px · z∈[-294,+71]mm · 2 of 90 slices shown (11 of 13)]
[im 1/90]
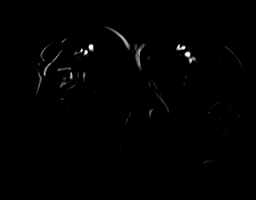
[im 90/90]
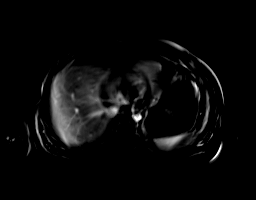

[Series 1041: T1 dynamic · axial · 4.1mm · 1.64mm/px · z∈[-294,+71]mm · 2 of 90 slices shown (12 of 13)]
[im 1/90]
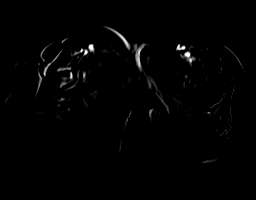
[im 90/90]
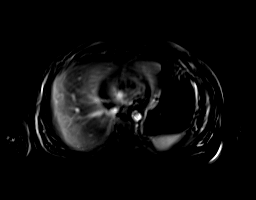

[Series 1045: T1 dynamic · axial · 4.1mm · 1.64mm/px · z∈[-294,+71]mm · 2 of 90 slices shown (13 of 13)]
[im 1/90]
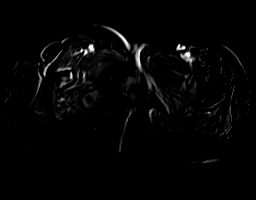
[im 90/90]
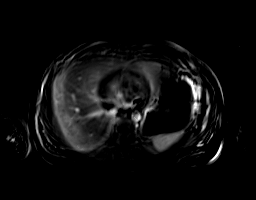

[48 of 48 positions shown; findings below may reference images not displayed]

FINDINGS: COMBINED FINDINGS FOR BOTH MR ABDOMEN AND PELVIS

Lower chest: Unremarkable.

Hepatobiliary: Assessment of liver is limited by artifact from field
of view limitations. Within this limitation no gross parenchymal
abnormality evident. There is no evidence for gallstones,
gallbladder wall thickening, or pericholecystic fluid. No
intrahepatic or extrahepatic biliary dilation.

Pancreas: No focal mass lesion. No dilatation of the main duct. No
intraparenchymal cyst. No peripancreatic edema.

Spleen:  No splenomegaly. No focal mass lesion.

Adrenals/Urinary Tract: No adrenal nodule or mass. Kidneys
unremarkable. No evidence for hydroureter. The urinary bladder
appears normal for the degree of distention.

Stomach/Bowel: Stomach is well distended with contrast. No wall
thickening or abnormal mural enhancement. Evaluation of small-[REDACTED]edly limited by substantial motion degradation. Within this
limitation there is no evidence for small bowel obstruction. No
gross abnormal small bowel enhancement although assessment is
markedly limited by the substantial motion degradation. Terminal
ileum not well seen on postcontrast imaging. Large colonic stool
volume noted.

Vascular/Lymphatic: No abdominal aortic aneurysm. No abdominal
aortic atherosclerotic calcification. There is no gastrohepatic or
hepatoduodenal ligament lymphadenopathy. No retroperitoneal or
mesenteric lymphadenopathy. No pelvic sidewall lymphadenopathy.

Reproductive: The uterus is unremarkable.  There is no adnexal mass.

Other:  No intraperitoneal free fluid.

Musculoskeletal: No suspicious marrow enhancement within the
visualized bony anatomy.
IMPRESSION: 1. Marked study limitation due to motion artifact, likely from
prominent small bowel motility. Within this limitation there is no
evidence for small bowel obstruction. No gross abnormal small bowel
wall thickening or enhancement is evident although sensitivity is
limited due to artifact.
2. Large colonic stool volume noted. Imaging features compatible
with constipation in the appropriate clinical setting.

## 2021-12-07 ENCOUNTER — Other Ambulatory Visit: Payer: Self-pay

## 2021-12-07 ENCOUNTER — Emergency Department
Admission: EM | Admit: 2021-12-07 | Discharge: 2021-12-07 | Disposition: A | Discharge status: Home / Self Care | Payer: BC Managed Care – PPO | Attending: Emergency Medicine | Admitting: Emergency Medicine

## 2021-12-07 ENCOUNTER — Encounter: Payer: Self-pay | Admitting: Emergency Medicine

## 2021-12-07 DIAGNOSIS — R112 Nausea with vomiting, unspecified: Secondary | ICD-10-CM | POA: Diagnosis not present

## 2021-12-07 DIAGNOSIS — D72829 Elevated white blood cell count, unspecified: Secondary | ICD-10-CM | POA: Insufficient documentation

## 2021-12-07 DIAGNOSIS — R1031 Right lower quadrant pain: Secondary | ICD-10-CM | POA: Diagnosis present

## 2021-12-07 LAB — URINALYSIS, ROUTINE W REFLEX MICROSCOPIC
Bacteria, UA: NONE SEEN
Bilirubin Urine: NEGATIVE
Glucose, UA: NEGATIVE mg/dL
Ketones, ur: NEGATIVE mg/dL
Leukocytes,Ua: NEGATIVE
Nitrite: NEGATIVE
Protein, ur: NEGATIVE mg/dL
Specific Gravity, Urine: 1.013 (ref 1.005–1.030)
WBC, UA: NONE SEEN WBC/hpf (ref 0–5)
pH: 8 (ref 5.0–8.0)

## 2021-12-07 LAB — CBC
HCT: 44.6 % (ref 36.0–46.0)
Hemoglobin: 14.9 g/dL (ref 12.0–15.0)
MCH: 28.4 pg (ref 26.0–34.0)
MCHC: 33.4 g/dL (ref 30.0–36.0)
MCV: 85.1 fL (ref 80.0–100.0)
Platelets: 480 10*3/uL — ABNORMAL HIGH (ref 150–400)
RBC: 5.24 MIL/uL — ABNORMAL HIGH (ref 3.87–5.11)
RDW: 12.9 % (ref 11.5–15.5)
WBC: 14.6 10*3/uL — ABNORMAL HIGH (ref 4.0–10.5)
nRBC: 0 % (ref 0.0–0.2)

## 2021-12-07 LAB — COMPREHENSIVE METABOLIC PANEL
ALT: 20 U/L (ref 0–44)
AST: 26 U/L (ref 15–41)
Albumin: 4.6 g/dL (ref 3.5–5.0)
Alkaline Phosphatase: 62 U/L (ref 38–126)
Anion gap: 9 (ref 5–15)
BUN: 14 mg/dL (ref 6–20)
CO2: 29 mmol/L (ref 22–32)
Calcium: 9.8 mg/dL (ref 8.9–10.3)
Chloride: 105 mmol/L (ref 98–111)
Creatinine, Ser: 0.91 mg/dL (ref 0.44–1.00)
GFR, Estimated: >60 mL/min (ref >60)
Glucose, Bld: 120 mg/dL — ABNORMAL HIGH (ref 70–99)
Potassium: 3.9 mmol/L (ref 3.5–5.1)
Sodium: 143 mmol/L (ref 135–145)
Total Bilirubin: 0.7 mg/dL (ref 0.3–1.2)
Total Protein: 8 g/dL (ref 6.5–8.1)

## 2021-12-07 LAB — POC URINE PREG, ED: Preg Test, Ur: NEGATIVE

## 2021-12-07 NOTE — ED Provider Notes (Signed)
[facility] Provider Note    Event Date/Time   First MD Initiated Contact with Patient 12/07/21 [phone]     (approximate)   History   Abdominal Pain   HPI  Ann Gibson is a 36 y.o. female with no significant past medical history who presents with complaints of right lower quadrant abdominal pain.  Patient reports the pain started last night and continued throughout the night.  However about 1 hour ago it improved and she is feeling better.  She reports earlier she was having nausea and vomiting.  She reports 2 years ago she had a partial small bowel obstruction which required admission, this felt similar however that did not improve without intervention.  No history of abdominal surgery     Physical Exam   Triage Vital Signs: ED Triage Vitals  Enc Vitals Group     BP 12/07/21 0448 122/60     Pulse Rate 12/07/21 0448 89     Resp 12/07/21 0448 16     Temp 12/07/21 0448 98.3 F (36.8 C)     Temp Source 12/07/21 0448 Oral     SpO2 12/07/21 0448 100 %     Weight 12/07/21 0449 70.3 kg (155 lb)     Height 12/07/21 0449 1.626 m (5\' 4" )     Head Circumference --      Peak Flow --      Pain Score 12/07/21 0449 2     Pain Loc --      Pain Edu? --      Excl. in GC? --     Most recent vital signs: Vitals:   12/07/21 0448 12/07/21 0747  BP: 122/60 120/68  Pulse: 89 88  Resp: 16 16  Temp: 98.3 F (36.8 C)   SpO2: 100% 100%     General: Awake, no distress.  CV:  Good peripheral perfusion.  Resp:  Normal effort.  Abd:  No distention.  No tenderness to palpation, no CVA tenderness Other:     ED Results / Procedures / Treatments   Labs (all labs ordered are listed, but only abnormal results are displayed) Labs Reviewed  COMPREHENSIVE METABOLIC PANEL - Abnormal; Notable for the following components:      Result Value   Glucose, Bld 120 (*)    All other components within normal limits  CBC - Abnormal; Notable for the following components:    WBC 14.6 (*)    RBC 5.24 (*)    Platelets 480 (*)    All other components within normal limits  URINALYSIS, ROUTINE W REFLEX MICROSCOPIC - Abnormal; Notable for the following components:   Color, Urine YELLOW (*)    APPearance CLOUDY (*)    Hgb urine dipstick LARGE (*)    All other components within normal limits  POC URINE PREG, ED     EKG     RADIOLOGY     PROCEDURES:  Critical Care performed:   Procedures   MEDICATIONS ORDERED IN ED: Medications - No data to display   IMPRESSION / MDM / ASSESSMENT AND PLAN / ED COURSE  I reviewed the triage vital signs and the nursing notes. Patient's presentation is most consistent with acute presentation with potential threat to life or bodily function.  Patient presents with acute abdominal pain as detailed above.  Differential includes ureterolithiasis, UTI, appendicitis, less likely small bowel obstruction.  Lab work notable for hemoglobin in the urine however patient reports that she is menstruating.  White blood cell count  is mildly elevated which is nonspecific, she reports chronic elevation of white blood cell count.  CMP is normal.  Patient is now asymptomatic x1 hour.  Benign exam.  Not consistent with SBO or appendicitis.  Offered CT imaging however she is declining, she reports she is feeling better and can come back if any change in her symptoms.        FINAL CLINICAL IMPRESSION(S) / ED DIAGNOSES   Final diagnoses:  Right lower quadrant abdominal pain     Rx / DC Orders   ED Discharge Orders     None        Note:  This document was prepared using Dragon voice recognition software and may include unintentional dictation errors.   Jene Every, MD 12/07/21 [phone]

## 2021-12-07 NOTE — ED Triage Notes (Addendum)
Pt with right midand lower quadrant pain with vomiting for approx 2 days. Pt appears in no acute distress. Ems gave 4mg  of zofran iv. Pt with history of bowel obstruction.

## 2021-12-07 NOTE — ED Notes (Signed)
Pt is pain free currently. Was having sharp pain, with intense pain intermittently. Also vomiting took CBD oil before coming in.
